# Patient Record
Sex: Male | Born: 1985 | Race: Black or African American | Hispanic: No | Smoking: Current every day smoker
Health system: Southern US, Community
[De-identification: ages and names within clinical notes are randomized; demographics above are authoritative.]

## PROBLEM LIST (undated history)

## (undated) DIAGNOSIS — N289 Disorder of kidney and ureter, unspecified: Secondary | ICD-10-CM

## (undated) DIAGNOSIS — N209 Urinary calculus, unspecified: Secondary | ICD-10-CM

## (undated) DIAGNOSIS — Z87442 Personal history of urinary calculi: Secondary | ICD-10-CM

## (undated) HISTORY — PX: KNEE SURGERY: SHX244

## (undated) HISTORY — PX: ORTHOPEDIC SURGERY: SHX850

---

## 2004-02-15 ENCOUNTER — Other Ambulatory Visit: Admission: RE | Admit: 2004-02-15 | Discharge: 2004-02-15 | Payer: Self-pay | Admitting: Dermatology

## 2004-08-24 ENCOUNTER — Emergency Department (HOSPITAL_COMMUNITY): Admission: EM | Admit: 2004-08-24 | Discharge: 2004-08-24 | Payer: Self-pay | Admitting: Emergency Medicine

## 2005-04-17 ENCOUNTER — Emergency Department (HOSPITAL_COMMUNITY): Admission: EM | Admit: 2005-04-17 | Discharge: 2005-04-17 | Payer: Self-pay | Admitting: Emergency Medicine

## 2005-06-12 ENCOUNTER — Emergency Department (HOSPITAL_COMMUNITY): Admission: EM | Admit: 2005-06-12 | Discharge: 2005-06-12 | Payer: Self-pay | Admitting: Emergency Medicine

## 2008-08-21 ENCOUNTER — Emergency Department (HOSPITAL_COMMUNITY): Admission: EM | Admit: 2008-08-21 | Discharge: 2008-08-21 | Payer: Self-pay | Admitting: Emergency Medicine

## 2008-10-10 ENCOUNTER — Emergency Department (HOSPITAL_COMMUNITY): Admission: EM | Admit: 2008-10-10 | Discharge: 2008-10-10 | Payer: Self-pay | Admitting: Emergency Medicine

## 2011-08-06 LAB — GC/CHLAMYDIA PROBE AMP, GENITAL
Chlamydia, DNA Probe: NEGATIVE
GC Probe Amp, Genital: POSITIVE — AB

## 2011-08-10 LAB — GC/CHLAMYDIA PROBE AMP, GENITAL
Chlamydia, DNA Probe: NEGATIVE
GC Probe Amp, Genital: POSITIVE — AB

## 2012-02-21 ENCOUNTER — Emergency Department (HOSPITAL_COMMUNITY)
Admission: EM | Admit: 2012-02-21 | Discharge: 2012-02-21 | Disposition: A | Discharge status: Home / Self Care | Payer: Self-pay | Attending: Emergency Medicine | Admitting: Emergency Medicine

## 2012-02-21 ENCOUNTER — Encounter (HOSPITAL_COMMUNITY): Payer: Self-pay | Admitting: *Deleted

## 2012-02-21 DIAGNOSIS — L0291 Cutaneous abscess, unspecified: Secondary | ICD-10-CM

## 2012-02-21 DIAGNOSIS — F17200 Nicotine dependence, unspecified, uncomplicated: Secondary | ICD-10-CM | POA: Insufficient documentation

## 2012-02-21 DIAGNOSIS — N4829 Other inflammatory disorders of penis: Secondary | ICD-10-CM | POA: Insufficient documentation

## 2012-02-21 LAB — RPR: RPR Ser Ql: NONREACTIVE

## 2012-02-21 MED ORDER — SULFAMETHOXAZOLE-TRIMETHOPRIM 800-160 MG PO TABS: 1.0000 | ORAL_TABLET | Freq: Two times a day (BID) | ORAL | Status: AC

## 2012-02-21 MED ORDER — SULFAMETHOXAZOLE-TMP DS 800-160 MG PO TABS
1.0000 | ORAL_TABLET | Freq: Once | ORAL | Status: AC
  Administered 2012-02-21: 1 via ORAL
  Filled 2012-02-21: qty 1

## 2012-02-21 NOTE — ED Notes (Signed)
Boil on penis, denies pain

## 2012-02-21 NOTE — ED Provider Notes (Signed)
History     CSN: [id number]  Arrival date & time 02/21/12  1050   First MD Initiated Contact with Patient 02/21/12 1118      Chief Complaint  Patient presents with  . Abscess    (Consider location/radiation/quality/duration/timing/severity/associated sxs/prior treatment) HPI Comments: Pt has a lesion on his penis.  "i thought it was a hair bump"  Onset ~ 1 week ago.  He has been squeezing it and it is getting larger.  No urethral d/c.  + sexually active.  Partner has no sxs.  Patient is a 26 y.o. male presenting with abscess. The history is provided by the patient. No language interpreter was used.  Abscess  This is a new problem. The problem has been gradually worsening. Affected Location: penile shaft. The problem is moderate. It is unknown what he was exposed to. The abscess first occurred at home. Pertinent negatives include no fever. There were no sick contacts. He has received no recent medical care.    History reviewed. No pertinent past medical history.  Past Surgical History  Procedure Date  . Orthopedic surgery     No family history on file.  History  Substance Use Topics  . Smoking status: Current Everyday Smoker -- 1.0 packs/day    Types: Cigarettes  . Smokeless tobacco: Not on file  . Alcohol Use: Yes     sometimes      Review of Systems  Constitutional: Negative for fever.  Genitourinary: Positive for genital sores and penile pain. Negative for dysuria, urgency, frequency, hematuria, discharge, penile swelling, scrotal swelling and testicular pain.  Musculoskeletal: Negative for arthralgias.  Skin: Negative for rash.  Neurological: Negative for light-headedness.  All other systems reviewed and are negative.    Allergies  Review of patient's allergies indicates no known allergies.  Home Medications   Current Outpatient Rx  Name Route Sig Dispense Refill  . SULFAMETHOXAZOLE-TRIMETHOPRIM 800-160 MG PO TABS Oral Take 1 tablet by mouth every 12  (twelve) hours. 20 tablet 0    BP 119/64  Pulse 62  Temp(Src) 97.9 F (36.6 C) (Oral)  Resp 18  Ht 5\' 8"  (1.727 m)  Wt 140 lb (63.504 kg)  BMI 21.29 kg/m2  SpO2 100%  Physical Exam  Nursing note and vitals reviewed. Constitutional: He is oriented to person, place, and time. He appears well-developed and well-nourished.  HENT:  Head: Normocephalic and atraumatic.  Eyes: EOM are normal.  Neck: Normal range of motion.  Cardiovascular: Normal rate, regular rhythm, normal heart sounds and intact distal pulses.   Pulmonary/Chest: Effort normal and breath sounds normal. No respiratory distress.  Abdominal: Soft. He exhibits no distension. There is no tenderness.  Genitourinary: Testes normal. Circumcised. Penile tenderness present. No phimosis or paraphimosis. No discharge found.       One lesion on R mid shaft of penis.  Area is fluctant and ~ 1.5 cm in diam.    No erosion of borders typical of chancre.     Musculoskeletal: Normal range of motion.  Neurological: He is alert and oriented to person, place, and time. No cranial nerve deficit. Coordination normal.  Skin: Skin is warm and dry.  Psychiatric: He has a normal mood and affect. Judgment normal.    ED Course  Procedures (including critical care time)   Labs Reviewed  RPR   No results found.   1. Abscess       MDM  Doubt syphilitic chancre.  Probably an infected hair follicle  Now inflamed and infected from  Squeezing. rx- bactrim DS, 20 Warm compresses.   RPR pending.   F/u at [facility].        Worthy Rancher, PA 02/21/12 1243  Worthy Rancher, PA 02/21/12 1247

## 2012-02-21 NOTE — Discharge Instructions (Signed)
Abscess An abscess (boil or furuncle) is an infected area that contains a collection of pus.  SYMPTOMS Signs and symptoms of an abscess include pain, tenderness, redness, or hardness. You may feel a moveable soft area under your skin. An abscess can occur anywhere in the body.  TREATMENT  A surgical cut (incision) may be made over your abscess to drain the pus. Gauze may be packed into the space or a drain may be looped through the abscess cavity (pocket). This provides a drain that will allow the cavity to heal from the inside outwards. The abscess may be painful for a few days, but should feel much better if it was drained.  Your abscess, if seen early, may not have localized and may not have been drained. If not, another appointment may be required if it does not get better on its own or with medications. HOME CARE INSTRUCTIONS   Only take over-the-counter or prescription medicines for pain, discomfort, or fever as directed by your caregiver.   Take your antibiotics as directed if they were prescribed. Finish them even if you start to feel better.   Keep the skin and clothes clean around your abscess.   If the abscess was drained, you will need to use gauze dressing to collect any draining pus. Dressings will typically need to be changed 3 or more times a day.   The infection may spread by skin contact with others. Avoid skin contact as much as possible.   Practice good hygiene. This includes regular hand washing, cover any draining skin lesions, and do not share personal care items.   If you participate in sports, do not share athletic equipment, towels, whirlpools, or personal care items. Shower after every practice or tournament.   If a draining area cannot be adequately covered:   Do not participate in sports.   Children should not participate in day care until the wound has healed or drainage stops.   If your caregiver has given you a follow-up appointment, it is very important  to keep that appointment. Not keeping the appointment could result in a much worse infection, chronic or permanent injury, pain, and disability. If there is any problem keeping the appointment, you must call back to this facility for assistance.  SEEK MEDICAL CARE IF:   You develop increased pain, swelling, redness, drainage, or bleeding in the wound site.   You develop signs of generalized infection including muscle aches, chills, fever, or a general ill feeling.   You have an oral temperature above 102 F (38.9 C).  MAKE SURE YOU:   Understand these instructions.   Will watch your condition.   Will get help right away if you are not doing well or get worse.  Document Released: 08/01/2005 Document Revised: 10/11/2011 Document Reviewed: 05/25/2008 [facility] Patient Information 2012 [place], [place].   i don't think this is a syphilis lesion but more of an abscess.  Take the antibiotic as directed and apply warm compresses several times daily.  Call the health dept and make an appt for further evaluation..  If the syphilis test is positive we will notify you.

## 2012-02-21 NOTE — ED Provider Notes (Signed)
Medical screening examination/treatment/procedure(s) were performed by non-physician practitioner and as supervising physician I was immediately available for consultation/collaboration.  Flint Melter, MD 02/21/12 [phone]

## 2012-03-15 ENCOUNTER — Emergency Department (HOSPITAL_COMMUNITY)
Admission: EM | Admit: 2012-03-15 | Discharge: 2012-03-15 | Disposition: A | Discharge status: Home / Self Care | Payer: Self-pay | Attending: Emergency Medicine | Admitting: Emergency Medicine

## 2012-03-15 ENCOUNTER — Encounter (HOSPITAL_COMMUNITY): Payer: Self-pay | Admitting: Emergency Medicine

## 2012-03-15 ENCOUNTER — Emergency Department (HOSPITAL_COMMUNITY)
Admission: EM | Admit: 2012-03-15 | Discharge: 2012-03-15 | Discharge status: Against Medical Advice | Payer: Self-pay | Attending: Emergency Medicine | Admitting: Emergency Medicine

## 2012-03-15 DIAGNOSIS — R197 Diarrhea, unspecified: Secondary | ICD-10-CM | POA: Insufficient documentation

## 2012-03-15 DIAGNOSIS — M549 Dorsalgia, unspecified: Secondary | ICD-10-CM | POA: Insufficient documentation

## 2012-03-15 DIAGNOSIS — F17200 Nicotine dependence, unspecified, uncomplicated: Secondary | ICD-10-CM | POA: Insufficient documentation

## 2012-03-15 DIAGNOSIS — M545 Low back pain, unspecified: Secondary | ICD-10-CM | POA: Insufficient documentation

## 2012-03-15 DIAGNOSIS — A09 Infectious gastroenteritis and colitis, unspecified: Secondary | ICD-10-CM | POA: Insufficient documentation

## 2012-03-15 DIAGNOSIS — R112 Nausea with vomiting, unspecified: Secondary | ICD-10-CM | POA: Insufficient documentation

## 2012-03-15 DIAGNOSIS — K529 Noninfective gastroenteritis and colitis, unspecified: Secondary | ICD-10-CM

## 2012-03-15 LAB — CBC
HCT: 44.3 % (ref 39.0–52.0)
Hemoglobin: 14.4 g/dL (ref 13.0–17.0)
MCH: 31.4 pg (ref 26.0–34.0)
MCHC: 32.5 g/dL (ref 30.0–36.0)
MCV: 96.5 fL (ref 78.0–100.0)
Platelets: 198 10*3/uL (ref 150–400)
RBC: 4.59 MIL/uL (ref 4.22–5.81)
RDW: 13.3 % (ref 11.5–15.5)
WBC: 6.7 10*3/uL (ref 4.0–10.5)

## 2012-03-15 LAB — DIFFERENTIAL
Basophils Absolute: 0 10*3/uL (ref 0.0–0.1)
Basophils Relative: 0 % (ref 0–1)
Eosinophils Absolute: 0.1 10*3/uL (ref 0.0–0.7)
Eosinophils Relative: 1 % (ref 0–5)
Lymphocytes Relative: 32 % (ref 12–46)
Lymphs Abs: 2.2 10*3/uL (ref 0.7–4.0)
Monocytes Absolute: 0.5 10*3/uL (ref 0.1–1.0)
Monocytes Relative: 8 % (ref 3–12)
Neutro Abs: 3.9 10*3/uL (ref 1.7–7.7)
Neutrophils Relative %: 59 % (ref 43–77)

## 2012-03-15 LAB — BASIC METABOLIC PANEL
BUN: 12 mg/dL (ref 6–23)
CO2: 27 mEq/L (ref 19–32)
Calcium: 9.9 mg/dL (ref 8.4–10.5)
Chloride: 102 mEq/L (ref 96–112)
Creatinine, Ser: 1.04 mg/dL (ref 0.50–1.35)
GFR calc Af Amer: >90 mL/min (ref >90)
GFR calc non Af Amer: >90 mL/min (ref >90)
Glucose, Bld: 100 mg/dL — ABNORMAL HIGH (ref 70–99)
Potassium: 3.9 mEq/L (ref 3.5–5.1)
Sodium: 136 mEq/L (ref 135–145)

## 2012-03-15 MED ORDER — SODIUM CHLORIDE 0.9 % IV BOLUS (SEPSIS): 1000.0000 mL | Freq: Once | INTRAVENOUS | Status: DC

## 2012-03-15 MED ORDER — KETOROLAC TROMETHAMINE 30 MG/ML IJ SOLN: 30.0000 mg | Freq: Once | INTRAMUSCULAR | Status: DC

## 2012-03-15 NOTE — ED Notes (Signed)
Called to tx x 2. Pt not in waiting room. Told by triage nurse pt stated he had a family emergency and had to leave

## 2012-03-15 NOTE — Discharge Instructions (Signed)
Start with a clear liquid diet today, then gradually advance to bland diet tomorrow. Take ibuprofen 3 times a day with meals for pain. See the Dr. of your choice for problems.   Back Exercises Back exercises help treat and prevent back injuries. The goal of back exercises is to increase the strength of your abdominal and back muscles and the flexibility of your back. These exercises should be started when you no longer have back pain. Back exercises include:  Pelvic Tilt. Lie on your back with your knees bent. Tilt your pelvis until the lower part of your back is against the floor. Hold this position 5 to 10 sec and repeat 5 to 10 times.   Knee to Chest. Pull first 1 knee up against your chest and hold for 20 to 30 seconds, repeat this with the other knee, and then both knees. This may be done with the other leg straight or bent, whichever feels better.   Sit-Ups or Curl-Ups. Bend your knees 90 degrees. Start with tilting your pelvis, and do a partial, slow sit-up, lifting your trunk only 30 to 45 degrees off the floor. Take at least 2 to 3 seconds for each sit-up. Do not do sit-ups with your knees out straight. If partial sit-ups are difficult, simply do the above but with only tightening your abdominal muscles and holding it as directed.   Hip-Lift. Lie on your back with your knees flexed 90 degrees. Push down with your feet and shoulders as you raise your hips a couple inches off the floor; hold for 10 seconds, repeat 5 to 10 times.   Back arches. Lie on your stomach, propping yourself up on bent elbows. Slowly press on your hands, causing an arch in your low back. Repeat 3 to 5 times. Any initial stiffness and discomfort should lessen with repetition over time.   Shoulder-Lifts. Lie face down with arms beside your body. Keep hips and torso pressed to floor as you slowly lift your head and shoulders off the floor.  Do not overdo your exercises, especially in the beginning. Exercises may cause  you some mild back discomfort which lasts for a few minutes; however, if the pain is more severe, or lasts for more than 15 minutes, do not continue exercises until you see your caregiver. Improvement with exercise therapy for back problems is slow.  See your caregivers for assistance with developing a proper back exercise program. Document Released: 11/29/2004 Document Revised: 10/11/2011 Document Reviewed: 10/22/2005 [facility] Patient Information 2012 [place], [place].Viral Gastroenteritis Viral gastroenteritis is also called stomach flu. This illness is caused by a certain type of germ (virus). It can cause sudden watery poop (diarrhea) and throwing up (vomiting). This can cause you to lose body fluids (dehydration). This illness usually lasts for 3 to 8 days. It usually goes away on its own. HOME CARE   Drink enough fluids to keep your pee (urine) clear or pale yellow. Drink small amounts of fluids often.   Ask your doctor how to replace body fluid losses (rehydration).   Avoid:   Foods high in sugar.   Alcohol.   Bubbly (carbonated) drinks.   Tobacco.   Juice.   Caffeine drinks.   Very hot or cold fluids.   Fatty, greasy foods.   Eating too much at one time.   Dairy products until 24 to 48 hours after your watery poop stops.   You may eat foods with active cultures (probiotics). They can be found in some yogurts and supplements.  Wash your hands well to avoid spreading the illness.   Only take medicines as told by your doctor. Do not give aspirin to children. Do not take medicines for watery poop (antidiarrheals).   Ask your doctor if you should keep taking your regular medicines.   Keep all doctor visits as told.  GET HELP RIGHT AWAY IF:   You cannot keep fluids down.   You do not pee at least once every 6 to 8 hours.   You are short of breath.   You see blood in your poop or throw up. This may look like coffee grounds.   You have belly (abdominal) pain that  gets worse or is just in one small spot (localized).   You keep throwing up or having watery poop.   You have a fever.   The patient is a child younger than 3 months, and he or she has a fever.   The patient is a child older than 3 months, and he or she has a fever and problems that do not go away.   The patient is a child older than 3 months, and he or she has a fever and problems that suddenly get worse.   The patient is a baby, and he or she has no tears when crying.  MAKE SURE YOU:   Understand these instructions.   Will watch your condition.   Will get help right away if you are not doing well or get worse.  Document Released: 04/09/2008 Document Revised: 10/11/2011 Document Reviewed: 08/08/2011 [facility] Patient Information 2012 [place], [place].  RESOURCE GUIDE  Dental Problems  Patients with Medicaid: [facility] [phone]                                           [phone] [address] [employer] [address] Phone:  [phone]                                                  Phone:  [phone]  If unable to pay or uninsured, contact:  [facility] or [facility]. to become qualified for the adult dental clinic.  Chronic Pain Problems Contact [facility]  [phone] Patients need to be referred by their primary care doctor.  Insufficient Money for Medicine Contact [employer]:  call "211" or [facility] 510-766-2349.  No Primary Care Doctor Call [facility]  [phone] Other agencies that provide inexpensive medical care    [facility]  [phone]    [facility] Internal Medicine  [phone]    [facility]  [phone]    [facility]  [phone]    [facility]  [phone]    [facility]  [phone]  Psychological Services [facility] Behavioral Health  [phone] [facility]  [phone] [facility] Mental Health   [phone] (emergency services  [phone])  Substance Abuse Resources [facility]  ([phone] [facility] [phone] [facility] [phone] Floydene Flock [phone] Residential & Outpatient Substance Abuse Program  [phone]  Abuse/Neglect [facility] Child Abuse Hotline (218) 202-4401 [place]  [address] Child Abuse Hotline 320-096-8710 (After Hours)  Emergency Shelter [facility] [phone]  M[facility] [phone] [facility] ([phone]  MRSA Hotline #:   409-034-7047    [facility] Resources  [facility]     [employer]                          [facility] Dept. [address] [place]                       [address]      [address]  Blondell Reveal Phone:  [phone]                                   Phone:  [phone]                 Phone:  [phone]  [facility] Mental Health Phone:  [phone]  [facility] Child Abuse Hotline (732)588-8678 [phone] (After Hours)

## 2012-03-15 NOTE — ED Provider Notes (Signed)
History     CSN: [id number]  Arrival date & time 03/15/12  1554   First MD Initiated Contact with Patient 03/15/12 1635      Chief Complaint  Patient presents with  . Back Pain  . Nausea    (Consider location/radiation/quality/duration/timing/severity/associated sxs/prior treatment) HPI Comments: Drayson Dorko is a 26 y.o. Male who complains of vomiting, diarrhea for 2 days. He denies fever, chills, abdominal pain, dysuria, blood in emesis or stool. His girlfriend was sick with a similar process 5 days ago. He has also had low back pain for 2 months worse when sitting; without known trauma as cause. He has not taken any medication for the back pain or the enteritis symptoms. He does not have chronic back pain or prior GI disturbances.  Patient is a 26 y.o. male presenting with back pain. The history is provided by the patient.  Back Pain     History reviewed. No pertinent past medical history.  Past Surgical History  Procedure Date  . Orthopedic surgery     History reviewed. No pertinent family history.  History  Substance Use Topics  . Smoking status: Current Everyday Smoker -- 1.0 packs/day    Types: Cigarettes  . Smokeless tobacco: Not on file  . Alcohol Use: Yes     sometimes      Review of Systems  Musculoskeletal: Positive for back pain.  All other systems reviewed and are negative.    Allergies  Review of patient's allergies indicates no known allergies.  Home Medications  No current outpatient prescriptions on file.  BP 126/67  Pulse 60  Temp(Src) 98.3 F (36.8 C) (Oral)  Resp 20  SpO2 95%  Physical Exam  Nursing note and vitals reviewed. Constitutional: He is oriented to person, place, and time. He appears well-developed and well-nourished.  HENT:  Head: Normocephalic and atraumatic.  Right Ear: External ear normal.  Left Ear: External ear normal.  Eyes: Conjunctivae and EOM are normal. Pupils are equal, round, and reactive to light.    Neck: Normal range of motion and phonation normal. Neck supple.  Cardiovascular: Normal rate, regular rhythm, normal heart sounds and intact distal pulses.   Pulmonary/Chest: Effort normal and breath sounds normal. He exhibits no bony tenderness.  Abdominal: Soft. Normal appearance. There is no tenderness.  Musculoskeletal: Normal range of motion.       No pain in low back with sitting. No palpable lumbar tenderness.  Neurological: He is alert and oriented to person, place, and time. He has normal strength. No cranial nerve deficit or sensory deficit. He exhibits normal muscle tone. Coordination normal.  Skin: Skin is warm, dry and intact.  Psychiatric: He has a normal mood and affect. His behavior is normal. Judgment and thought content normal.    ED Course  Procedures (including critical care time)  \ Emergency department treatment: IV fluids, and IV Toradol  Labs Reviewed - No data to display No results found.   1. Gastroenteritis, infectious, presumed   2. Low back pain       MDM  Signs, and symptoms of patient, is nontoxic and stable for discharge with outpatient management. His low back pain is incidental and likely represents nonspecific muscular pain. Doubt serous bacterial infection, metabolic instability, structural disorder of the lumbar spine or neuropathy.    Plan: Home Medications- Advil prn pain; Home Treatments- gradually advance diet; Recommended follow up- PCP of choice prn        Flint Melter, MD 03/17/12 1506

## 2012-03-15 NOTE — ED Notes (Signed)
Pt c/o Mid L side back pain intemrittant x 2 months. N/v/d x 2 days. Nad. Mm dry.

## 2012-03-15 NOTE — ED Notes (Signed)
Pt c/o L side mid back pain x 2 months-pain worse with movement.  with n/v/d x 2 days. Mm wet. nad

## 2012-04-21 ENCOUNTER — Encounter (HOSPITAL_COMMUNITY): Payer: Self-pay | Admitting: *Deleted

## 2012-04-21 ENCOUNTER — Emergency Department (HOSPITAL_COMMUNITY)
Admission: EM | Admit: 2012-04-21 | Discharge: 2012-04-21 | Disposition: A | Discharge status: Home / Self Care | Payer: Self-pay | Attending: Emergency Medicine | Admitting: Emergency Medicine

## 2012-04-21 DIAGNOSIS — L738 Other specified follicular disorders: Secondary | ICD-10-CM | POA: Insufficient documentation

## 2012-04-21 DIAGNOSIS — L678 Other hair color and hair shaft abnormalities: Secondary | ICD-10-CM | POA: Insufficient documentation

## 2012-04-21 DIAGNOSIS — L739 Follicular disorder, unspecified: Secondary | ICD-10-CM

## 2012-04-21 DIAGNOSIS — L0201 Cutaneous abscess of face: Secondary | ICD-10-CM

## 2012-04-21 MED ORDER — SULFAMETHOXAZOLE-TRIMETHOPRIM 800-160 MG PO TABS: 1.0000 | ORAL_TABLET | Freq: Two times a day (BID) | ORAL | Status: AC

## 2012-04-21 MED ORDER — LIDOCAINE-EPINEPHRINE (PF) 2 %-1:200000 IJ SOLN
INTRAMUSCULAR | Status: AC
  Administered 2012-04-21: 13:00:00
  Filled 2012-04-21: qty 20

## 2012-04-21 MED ORDER — HYDROCODONE-ACETAMINOPHEN 5-325 MG PO TABS: 1.0000 | ORAL_TABLET | ORAL | Status: AC | PRN

## 2012-04-21 NOTE — ED Notes (Signed)
PA in to I&D 

## 2012-04-21 NOTE — ED Notes (Signed)
Swollen area lt jaw x 1 month.

## 2012-04-21 NOTE — ED Notes (Signed)
Awaiting d/c papers. 

## 2012-04-21 NOTE — Discharge Instructions (Signed)
Abscess An abscess (boil or furuncle) is an infected area that contains a collection of pus.  SYMPTOMS Signs and symptoms of an abscess include pain, tenderness, redness, or hardness. You may feel a moveable soft area under your skin. An abscess can occur anywhere in the body.  TREATMENT  A surgical cut (incision) may be made over your abscess to drain the pus. Gauze may be packed into the space or a drain may be looped through the abscess cavity (pocket). This provides a drain that will allow the cavity to heal from the inside outwards. The abscess may be painful for a few days, but should feel much better if it was drained.  Your abscess, if seen early, may not have localized and may not have been drained. If not, another appointment may be required if it does not get better on its own or with medications. HOME CARE INSTRUCTIONS   Only take over-the-counter or prescription medicines for pain, discomfort, or fever as directed by your caregiver.   Take your antibiotics as directed if they were prescribed. Finish them even if you start to feel better.   Keep the skin and clothes clean around your abscess.   If the abscess was drained, you will need to use gauze dressing to collect any draining pus. Dressings will typically need to be changed 3 or more times a day.   The infection may spread by skin contact with others. Avoid skin contact as much as possible.   Practice good hygiene. This includes regular hand washing, cover any draining skin lesions, and do not share personal care items.   If you participate in sports, do not share athletic equipment, towels, whirlpools, or personal care items. Shower after every practice or tournament.   If a draining area cannot be adequately covered:   Do not participate in sports.   Children should not participate in day care until the wound has healed or drainage stops.   If your caregiver has given you a follow-up appointment, it is very important  to keep that appointment. Not keeping the appointment could result in a much worse infection, chronic or permanent injury, pain, and disability. If there is any problem keeping the appointment, you must call back to this facility for assistance.  SEEK MEDICAL CARE IF:   You develop increased pain, swelling, redness, drainage, or bleeding in the wound site.   You develop signs of generalized infection including muscle aches, chills, fever, or a general ill feeling.   You have an oral temperature above 102 F (38.9 C).  MAKE SURE YOU:   Understand these instructions.   Will watch your condition.   Will get help right away if you are not doing well or get worse.  Document Released: 08/01/2005 Document Revised: 10/11/2011 Document Reviewed: 05/25/2008 [facility] Patient Information 2012 [place], [place].   Start warm soaks as discussed with warm water for 10 minutes 4 times daily.  Start antibiotics intake until completed.  You can use the medicine prescribed if needed for extra pain relief if your ibuprofen is not helpful.  Do not drive within 4 hours of taking this medication as it will make you drowsy.  Return here for recheck if your symptoms worsen in any way.

## 2012-04-23 NOTE — ED Provider Notes (Signed)
Medical screening examination/treatment/procedure(s) were performed by non-physician practitioner and as supervising physician I was immediately available for consultation/collaboration.   Laray Anger, DO 04/23/12 1307

## 2012-04-23 NOTE — ED Provider Notes (Signed)
History     CSN: [id number]  Arrival date & time 04/21/12  1143   First MD Initiated Contact with Patient 04/21/12 1156      Chief Complaint  Patient presents with  . Abscess    (Consider location/radiation/quality/duration/timing/severity/associated sxs/prior treatment) HPI Comments: Alex Moore presents with a 1 month history of swelling along his left lower jaw line which has intermittently drained a small amount of white fluid.  It has become more tender the past several days.  He believes he has an ingrown hair causing the problem.  He denies fevers and chills and has no other complaints,  Including no dental pain.  Pain is constant but worse with palpation and pressure,  He has found no alleviators.    Patient is a 26 y.o. male presenting with abscess. The history is provided by the patient.  Abscess  Pertinent negatives include no fever.    History reviewed. No pertinent past medical history.  Past Surgical History  Procedure Date  . Orthopedic surgery     History reviewed. No pertinent family history.  History  Substance Use Topics  . Smoking status: Current Everyday Smoker -- 1.0 packs/day    Types: Cigarettes  . Smokeless tobacco: Not on file  . Alcohol Use: No     sometimes      Review of Systems  Constitutional: Negative for fever and chills.  HENT: Negative for facial swelling.   Respiratory: Negative for shortness of breath and wheezing.   Skin: Positive for wound. Negative for color change.  Neurological: Negative for numbness.    Allergies  Review of patient's allergies indicates no known allergies.  Home Medications   Current Outpatient Rx  Name Route Sig Dispense Refill  . HYDROCODONE-ACETAMINOPHEN 5-325 MG PO TABS Oral Take 1 tablet by mouth every 4 (four) hours as needed for pain. 15 tablet 0  . SULFAMETHOXAZOLE-TRIMETHOPRIM 800-160 MG PO TABS Oral Take 1 tablet by mouth 2 (two) times daily. 20 tablet 0    BP 115/65  Pulse 68   Temp 97.6 F (36.4 C) (Oral)  Resp 20  Ht 5\' 8"  (1.727 m)  Wt 140 lb (63.504 kg)  BMI 21.29 kg/m2  SpO2 100%  Physical Exam  Constitutional: He is oriented to person, place, and time. He appears well-developed and well-nourished.  HENT:  Head: Normocephalic.  Cardiovascular: Normal rate.   Pulmonary/Chest: Effort normal.  Musculoskeletal: He exhibits tenderness.  Neurological: He is alert and oriented to person, place, and time. No sensory deficit.  Skin:       Small,  Indurated lesion left anterior face along anterior edge of jawline,  No red streaking,  Fluctuance or surrounding erythema.      ED Course  Procedures (including critical care time)  Labs Reviewed - No data to display No results found.   1. Facial abscess   2. Folliculitis    Discussed warm soaks and antibiotics vs I & D.  Given this is prominent facial area,  Deferred on full I &D,  But willing needle aspiration.  INCISION AND DRAINAGE Performed by: Burgess Amor Consent: Verbal consent obtained. Risks and benefits: risks, benefits and alternatives were discussed Type: abscess  Body area: left jaw Anesthesia: local infiltration  Local anesthetic: lidocaine 2% with epinephrine  Anesthetic total: 1 ml  Complexity: simple needle aspiration.   Drainage: purulent  Drainage amount: trace  Packing material: none  Patient tolerance: Patient tolerated the procedure well with no immediate complications.  1 long facial hair was  removed from center of lesion.      MDM  Pt encouraged warm soaks,  Prescribed septra,  Hydrocodone.  Recheck if not improving over the next week.        Burgess Amor, [place] 04/23/12 [phone]

## 2012-07-19 ENCOUNTER — Emergency Department (HOSPITAL_COMMUNITY)
Admission: EM | Admit: 2012-07-19 | Discharge: 2012-07-19 | Disposition: A | Discharge status: Home / Self Care | Payer: Self-pay | Attending: Emergency Medicine | Admitting: Emergency Medicine

## 2012-07-19 ENCOUNTER — Encounter (HOSPITAL_COMMUNITY): Payer: Self-pay | Admitting: Emergency Medicine

## 2012-07-19 DIAGNOSIS — R109 Unspecified abdominal pain: Secondary | ICD-10-CM | POA: Insufficient documentation

## 2012-07-19 DIAGNOSIS — F17200 Nicotine dependence, unspecified, uncomplicated: Secondary | ICD-10-CM | POA: Insufficient documentation

## 2012-07-19 LAB — CBC
HCT: 44.7 % (ref 39.0–52.0)
Hemoglobin: 15.1 g/dL (ref 13.0–17.0)
MCH: 32.8 pg (ref 26.0–34.0)
MCHC: 33.8 g/dL (ref 30.0–36.0)
MCV: 97.2 fL (ref 78.0–100.0)
Platelets: 210 10*3/uL (ref 150–400)
RBC: 4.6 MIL/uL (ref 4.22–5.81)
RDW: 13.9 % (ref 11.5–15.5)
WBC: 8.3 10*3/uL (ref 4.0–10.5)

## 2012-07-19 LAB — LIPASE, BLOOD: Lipase: 26 U/L (ref 11–59)

## 2012-07-19 LAB — URINALYSIS, ROUTINE W REFLEX MICROSCOPIC
Bilirubin Urine: NEGATIVE
Glucose, UA: NEGATIVE mg/dL
Hgb urine dipstick: NEGATIVE
Ketones, ur: NEGATIVE mg/dL
Leukocytes, UA: NEGATIVE
Nitrite: NEGATIVE
Protein, ur: NEGATIVE mg/dL
Specific Gravity, Urine: 1.015 (ref 1.005–1.030)
Urobilinogen, UA: 0.2 mg/dL (ref 0.0–1.0)
pH: 7.5 (ref 5.0–8.0)

## 2012-07-19 LAB — COMPREHENSIVE METABOLIC PANEL
ALT: 15 U/L (ref 0–53)
AST: 29 U/L (ref 0–37)
Albumin: 4.2 g/dL (ref 3.5–5.2)
Alkaline Phosphatase: 52 U/L (ref 39–117)
BUN: 18 mg/dL (ref 6–23)
CO2: 30 mEq/L (ref 19–32)
Calcium: 9.7 mg/dL (ref 8.4–10.5)
Chloride: 101 mEq/L (ref 96–112)
Creatinine, Ser: 1.14 mg/dL (ref 0.50–1.35)
GFR calc Af Amer: >90 mL/min (ref >90)
GFR calc non Af Amer: 87 mL/min — ABNORMAL LOW (ref >90)
Glucose, Bld: 72 mg/dL (ref 70–99)
Potassium: 3.9 mEq/L (ref 3.5–5.1)
Sodium: 138 mEq/L (ref 135–145)
Total Bilirubin: 0.3 mg/dL (ref 0.3–1.2)
Total Protein: 7 g/dL (ref 6.0–8.3)

## 2012-07-19 MED ORDER — GI COCKTAIL ~~LOC~~
30.0000 mL | Freq: Once | ORAL | Status: AC
  Administered 2012-07-19: 30 mL via ORAL
  Filled 2012-07-19: qty 30

## 2012-07-19 MED ORDER — ONDANSETRON 8 MG PO TBDP
8.0000 mg | ORAL_TABLET | Freq: Once | ORAL | Status: AC
  Administered 2012-07-19: 8 mg via ORAL
  Filled 2012-07-19: qty 1

## 2012-07-19 MED ORDER — FAMOTIDINE 20 MG PO TABS
20.0000 mg | ORAL_TABLET | Freq: Once | ORAL | Status: AC
  Administered 2012-07-19: 20 mg via ORAL
  Filled 2012-07-19: qty 1

## 2012-07-19 MED ORDER — HYDROCODONE-ACETAMINOPHEN 5-325 MG PO TABS
1.0000 | ORAL_TABLET | Freq: Once | ORAL | Status: AC
  Administered 2012-07-19: 1 via ORAL
  Filled 2012-07-19: qty 1

## 2012-07-19 NOTE — ED Notes (Signed)
Pt discharged. Pt stable at time of discharge. pt has no questions regarding discharge at this time. Pt voiced understanding of discharge instructions.  

## 2012-07-19 NOTE — ED Notes (Signed)
Patient complaining of abdominal pain and nausea since yesterday. Denies vomiting / diarrhea.

## 2012-07-19 NOTE — ED Provider Notes (Signed)
History     CSN: [id number]  Arrival date & time 07/19/12  1900   First MD Initiated Contact with Patient 07/19/12 1924      Chief Complaint  Patient presents with  . Abdominal Pain  . Nausea    (Consider location/radiation/quality/duration/timing/severity/associated sxs/prior treatment) Patient is a 26 y.o. male presenting with abdominal pain. The history is provided by the patient.  Abdominal Pain The primary symptoms of the illness include abdominal pain. The primary symptoms of the illness do not include fever, shortness of breath, vomiting, diarrhea or dysuria.  Symptoms associated with the illness do not include constipation, hematuria or back pain.  pt c/o mid abdominal pain, nausea, 'upset stomach', for past day. No vomiting. Is eating/drinking. Had normal bm today. No fever or chills. No known bad food ingestion or ill contacts. Denies prior abdominal surgery. No hx pud, gallstones or pancreatitis. Pain mild-mod, dull, cramping, no radiation, without specific exacerbating or alleviating factors. No scrotal or testicular pain. No gu c/o. No back or flank pain. Denies cough or uri c/o. No cp or sob.       History reviewed. No pertinent past medical history.  Past Surgical History  Procedure Date  . Orthopedic surgery     History reviewed. No pertinent family history.  History  Substance Use Topics  . Smoking status: Current Every Day Smoker -- 1.0 packs/day    Types: Cigarettes  . Smokeless tobacco: Not on file  . Alcohol Use: Yes     occasionally      Review of Systems  Constitutional: Negative for fever.  HENT: Negative for sore throat and neck pain.   Eyes: Negative for redness.  Respiratory: Negative for cough and shortness of breath.   Cardiovascular: Negative for chest pain.  Gastrointestinal: Positive for abdominal pain. Negative for vomiting, diarrhea and constipation.  Genitourinary: Negative for dysuria, hematuria, flank pain, scrotal swelling and  testicular pain.  Musculoskeletal: Negative for back pain.  Skin: Negative for rash.  Neurological: Negative for headaches.  Hematological: Does not bruise/bleed easily.  Psychiatric/Behavioral: Negative for confusion.    Allergies  Review of patient's allergies indicates no known allergies.  Home Medications  No current outpatient prescriptions on file.  BP 131/79  Pulse 66  Temp 97.8 F (36.6 C) (Oral)  Resp 14  Ht 5\' 8"  (1.727 m)  Wt 140 lb (63.504 kg)  BMI 21.29 kg/m2  SpO2 100%  Physical Exam  Nursing note and vitals reviewed. Constitutional: He is oriented to person, place, and time. He appears well-developed and well-nourished. No distress.  HENT:  Head: Atraumatic.  Eyes: Conjunctivae normal are normal. No scleral icterus.  Neck: Neck supple. No tracheal deviation present.  Cardiovascular: Normal rate, regular rhythm, normal heart sounds and intact distal pulses.   Pulmonary/Chest: Effort normal and breath sounds normal. No accessory muscle usage. No respiratory distress.  Abdominal: Soft. Bowel sounds are normal. He exhibits no distension and no mass. There is no tenderness. There is no rebound and no guarding.  Genitourinary:       No cva tenderness. No hernias. No scrotal/testicular pain or tenderness.   Musculoskeletal: Normal range of motion.  Neurological: He is alert and oriented to person, place, and time.  Skin: Skin is warm and dry.  Psychiatric: He has a normal mood and affect.    ED Course  Procedures (including critical care time)   Labs Reviewed  CBC  COMPREHENSIVE METABOLIC PANEL  LIPASE, BLOOD  URINALYSIS, ROUTINE W REFLEX MICROSCOPIC  Results for orders placed during the hospital encounter of 07/19/12  CBC      Component Value Range   WBC 8.3  4.0 - 10.5 K/uL   RBC 4.60  4.22 - 5.81 MIL/uL   Hemoglobin 15.1  13.0 - 17.0 g/dL   HCT 04.5  40.9 - 81.1 %   MCV 97.2  78.0 - 100.0 fL   MCH 32.8  26.0 - 34.0 pg   MCHC 33.8  30.0 - 36.0  g/dL   RDW 91.4  78.2 - 95.6 %   Platelets 210  150 - 400 K/uL  COMPREHENSIVE METABOLIC PANEL      Component Value Range   Sodium 138  135 - 145 mEq/L   Potassium 3.9  3.5 - 5.1 mEq/L   Chloride 101  96 - 112 mEq/L   CO2 30  19 - 32 mEq/L   Glucose, Bld 72  70 - 99 mg/dL   BUN 18  6 - 23 mg/dL   Creatinine, Ser 2.13  0.50 - 1.35 mg/dL   Calcium 9.7  8.4 - 08.6 mg/dL   Total Protein 7.0  6.0 - 8.3 g/dL   Albumin 4.2  3.5 - 5.2 g/dL   AST 29  0 - 37 U/L   ALT 15  0 - 53 U/L   Alkaline Phosphatase 52  39 - 117 U/L   Total Bilirubin 0.3  0.3 - 1.2 mg/dL   GFR calc non Af Amer 87 (*) >90 mL/min   GFR calc Af Amer >90  >90 mL/min  LIPASE, BLOOD      Component Value Range   Lipase 26  11 - 59 U/L  URINALYSIS, ROUTINE W REFLEX MICROSCOPIC      Component Value Range   Color, Urine YELLOW  YELLOW   APPearance CLOUDY (*) CLEAR   Specific Gravity, Urine 1.015  1.005 - 1.030   pH 7.5  5.0 - 8.0   Glucose, UA NEGATIVE  NEGATIVE mg/dL   Hgb urine dipstick NEGATIVE  NEGATIVE   Bilirubin Urine NEGATIVE  NEGATIVE   Ketones, ur NEGATIVE  NEGATIVE mg/dL   Protein, ur NEGATIVE  NEGATIVE mg/dL   Urobilinogen, UA 0.2  0.0 - 1.0 mg/dL   Nitrite NEGATIVE  NEGATIVE   Leukocytes, UA NEGATIVE  NEGATIVE        MDM  Labs. pepcid po. Gi cocktail po. vicodin 1 po.  Recheck pt sleeping.  Recheck abd soft non tender.          Suzi Roots, MD 07/19/12 2051

## 2012-09-22 ENCOUNTER — Emergency Department (HOSPITAL_COMMUNITY)
Admission: EM | Admit: 2012-09-22 | Discharge: 2012-09-22 | Disposition: A | Discharge status: Home / Self Care | Payer: Self-pay | Attending: Emergency Medicine | Admitting: Emergency Medicine

## 2012-09-22 ENCOUNTER — Emergency Department (HOSPITAL_COMMUNITY): Payer: Self-pay

## 2012-09-22 ENCOUNTER — Encounter (HOSPITAL_COMMUNITY): Payer: Self-pay | Admitting: Emergency Medicine

## 2012-09-22 DIAGNOSIS — S62309A Unspecified fracture of unspecified metacarpal bone, initial encounter for closed fracture: Secondary | ICD-10-CM | POA: Insufficient documentation

## 2012-09-22 DIAGNOSIS — F17200 Nicotine dependence, unspecified, uncomplicated: Secondary | ICD-10-CM | POA: Insufficient documentation

## 2012-09-22 DIAGNOSIS — Y929 Unspecified place or not applicable: Secondary | ICD-10-CM | POA: Insufficient documentation

## 2012-09-22 DIAGNOSIS — Y9389 Activity, other specified: Secondary | ICD-10-CM | POA: Insufficient documentation

## 2012-09-22 DIAGNOSIS — X58XXXA Exposure to other specified factors, initial encounter: Secondary | ICD-10-CM | POA: Insufficient documentation

## 2012-09-22 DIAGNOSIS — S6291XA Unspecified fracture of right wrist and hand, initial encounter for closed fracture: Secondary | ICD-10-CM

## 2012-09-22 MED ORDER — HYDROCODONE-ACETAMINOPHEN 5-325 MG PO TABS
2.0000 | ORAL_TABLET | Freq: Once | ORAL | Status: AC
  Administered 2012-09-22: 2 via ORAL
  Filled 2012-09-22: qty 2

## 2012-09-22 MED ORDER — IBUPROFEN 800 MG PO TABS: 800.0000 mg | ORAL_TABLET | Freq: Three times a day (TID) | ORAL | Status: DC

## 2012-09-22 MED ORDER — HYDROCODONE-ACETAMINOPHEN 5-325 MG PO TABS: 1.0000 | ORAL_TABLET | ORAL | Status: DC | PRN
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
## 2012-09-25 ENCOUNTER — Ambulatory Visit (INDEPENDENT_AMBULATORY_CARE_PROVIDER_SITE_OTHER): Payer: Self-pay | Admitting: Orthopedic Surgery

## 2012-09-25 ENCOUNTER — Encounter: Payer: Self-pay | Admitting: Orthopedic Surgery

## 2012-09-25 DIAGNOSIS — S62329A Displaced fracture of shaft of unspecified metacarpal bone, initial encounter for closed fracture: Secondary | ICD-10-CM | POA: Insufficient documentation

## 2012-09-25 MED ORDER — IBUPROFEN 800 MG PO TABS: 800.0000 mg | ORAL_TABLET | Freq: Three times a day (TID) | ORAL | Status: DC

## 2012-09-25 MED ORDER — HYDROCODONE-ACETAMINOPHEN 5-325 MG PO TABS: 1.0000 | ORAL_TABLET | ORAL | Status: AC | PRN

## 2012-09-25 NOTE — Patient Instructions (Addendum)
Keep  Cast dry   Do not get wet   If it gets wet dry with a hair dryer on low setting and call the office   
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
## 2012-09-25 NOTE — Progress Notes (Signed)
Patient ID: Alex Moore, male   DOB: 1986/01/30, 26 y.o.   MRN: [id number] Splint tight   Redone

## 2012-10-23 ENCOUNTER — Ambulatory Visit (INDEPENDENT_AMBULATORY_CARE_PROVIDER_SITE_OTHER): Payer: Self-pay

## 2012-10-23 ENCOUNTER — Encounter: Payer: Self-pay | Admitting: Orthopedic Surgery

## 2012-10-23 ENCOUNTER — Ambulatory Visit (INDEPENDENT_AMBULATORY_CARE_PROVIDER_SITE_OTHER): Payer: Self-pay | Admitting: Orthopedic Surgery

## 2012-10-23 VITALS — BP 90/60 | Ht 68.0 in | Wt 144.8 lb

## 2012-10-23 DIAGNOSIS — S62329A Displaced fracture of shaft of unspecified metacarpal bone, initial encounter for closed fracture: Secondary | ICD-10-CM

## 2012-10-23 DIAGNOSIS — S6290XA Unspecified fracture of unspecified wrist and hand, initial encounter for closed fracture: Secondary | ICD-10-CM

## 2012-10-23 DIAGNOSIS — S62309A Unspecified fracture of unspecified metacarpal bone, initial encounter for closed fracture: Secondary | ICD-10-CM
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
## 2012-10-23 NOTE — Patient Instructions (Signed)
Apply tape to finger x 4 weeks

## 2012-12-10 ENCOUNTER — Ambulatory Visit: Payer: Self-pay | Admitting: Orthopedic Surgery

## 2012-12-11 ENCOUNTER — Ambulatory Visit: Payer: Self-pay | Admitting: Orthopedic Surgery

## 2013-05-03 ENCOUNTER — Emergency Department (HOSPITAL_COMMUNITY): Payer: Self-pay

## 2013-05-03 ENCOUNTER — Encounter (HOSPITAL_COMMUNITY): Payer: Self-pay | Admitting: *Deleted

## 2013-05-03 ENCOUNTER — Emergency Department (HOSPITAL_COMMUNITY)
Admission: EM | Admit: 2013-05-03 | Discharge: 2013-05-03 | Disposition: A | Discharge status: Home / Self Care | Payer: Self-pay | Attending: Emergency Medicine | Admitting: Emergency Medicine

## 2013-05-03 DIAGNOSIS — S62329A Displaced fracture of shaft of unspecified metacarpal bone, initial encounter for closed fracture: Secondary | ICD-10-CM | POA: Insufficient documentation

## 2013-05-03 DIAGNOSIS — F17200 Nicotine dependence, unspecified, uncomplicated: Secondary | ICD-10-CM | POA: Insufficient documentation

## 2013-05-03 DIAGNOSIS — Y9289 Other specified places as the place of occurrence of the external cause: Secondary | ICD-10-CM | POA: Insufficient documentation

## 2013-05-03 DIAGNOSIS — Y9389 Activity, other specified: Secondary | ICD-10-CM | POA: Insufficient documentation

## 2013-05-03 DIAGNOSIS — X58XXXA Exposure to other specified factors, initial encounter: Secondary | ICD-10-CM | POA: Insufficient documentation

## 2013-05-03 DIAGNOSIS — S62339A Displaced fracture of neck of unspecified metacarpal bone, initial encounter for closed fracture: Secondary | ICD-10-CM

## 2013-05-03 MED ORDER — HYDROCODONE-ACETAMINOPHEN 5-325 MG PO TABS: 1.0000 | ORAL_TABLET | ORAL | Status: DC | PRN
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
## 2013-05-06 NOTE — ED Provider Notes (Signed)
Medical screening examination/treatment/procedure(s) were performed by non-physician practitioner and as supervising physician I was immediately available for consultation/collaboration.   Joya Gaskins, MD 05/06/13 [phone]

## 2013-05-12 ENCOUNTER — Emergency Department (HOSPITAL_COMMUNITY)
Admission: EM | Admit: 2013-05-12 | Discharge: 2013-05-13 | Disposition: A | Discharge status: Home / Self Care | Payer: Self-pay | Attending: Emergency Medicine | Admitting: Emergency Medicine

## 2013-05-12 ENCOUNTER — Other Ambulatory Visit: Payer: Self-pay

## 2013-05-12 ENCOUNTER — Encounter (HOSPITAL_COMMUNITY): Payer: Self-pay | Admitting: Emergency Medicine

## 2013-05-12 DIAGNOSIS — Z8781 Personal history of (healed) traumatic fracture: Secondary | ICD-10-CM | POA: Insufficient documentation

## 2013-05-12 DIAGNOSIS — T400X1A Poisoning by opium, accidental (unintentional), initial encounter: Secondary | ICD-10-CM | POA: Insufficient documentation

## 2013-05-12 DIAGNOSIS — F3289 Other specified depressive episodes: Secondary | ICD-10-CM | POA: Insufficient documentation

## 2013-05-12 DIAGNOSIS — T394X2A Poisoning by antirheumatics, not elsewhere classified, intentional self-harm, initial encounter: Secondary | ICD-10-CM | POA: Insufficient documentation

## 2013-05-12 DIAGNOSIS — T391X1A Poisoning by 4-Aminophenol derivatives, accidental (unintentional), initial encounter: Secondary | ICD-10-CM | POA: Insufficient documentation

## 2013-05-12 DIAGNOSIS — F329 Major depressive disorder, single episode, unspecified: Secondary | ICD-10-CM

## 2013-05-12 DIAGNOSIS — F17200 Nicotine dependence, unspecified, uncomplicated: Secondary | ICD-10-CM | POA: Insufficient documentation

## 2013-05-12 DIAGNOSIS — Z9181 History of falling: Secondary | ICD-10-CM | POA: Insufficient documentation

## 2013-05-12 DIAGNOSIS — F32A Depression, unspecified: Secondary | ICD-10-CM

## 2013-05-12 LAB — SALICYLATE LEVEL: Salicylate Lvl: <2 mg/dL — ABNORMAL LOW (ref 2.8–20.0)

## 2013-05-12 LAB — CBC WITH DIFFERENTIAL/PLATELET
Basophils Absolute: 0 10*3/uL (ref 0.0–0.1)
Basophils Relative: 0 % (ref 0–1)
Eosinophils Relative: 0 % (ref 0–5)
HCT: 48.3 % (ref 39.0–52.0)
Hemoglobin: 16.8 g/dL (ref 13.0–17.0)
Lymphocytes Relative: 7 % — ABNORMAL LOW (ref 12–46)
Lymphs Abs: 1.9 10*3/uL (ref 0.7–4.0)
MCV: 95.8 fL (ref 78.0–100.0)
Monocytes Relative: 3 % (ref 3–12)
Neutro Abs: 24.1 10*3/uL — ABNORMAL HIGH (ref 1.7–7.7)
RBC: 5.04 MIL/uL (ref 4.22–5.81)
RDW: 13.8 % (ref 11.5–15.5)
WBC: 26.8 10*3/uL — ABNORMAL HIGH (ref 4.0–10.5)

## 2013-05-12 LAB — COMPREHENSIVE METABOLIC PANEL
AST: 26 U/L (ref 0–37)
Albumin: 5.6 g/dL — ABNORMAL HIGH (ref 3.5–5.2)
Alkaline Phosphatase: 66 U/L (ref 39–117)
BUN: 12 mg/dL (ref 6–23)
CO2: 26 mEq/L (ref 19–32)
Chloride: 100 mEq/L (ref 96–112)
GFR calc non Af Amer: 81 mL/min — ABNORMAL LOW (ref >90)
Potassium: 4.4 mEq/L (ref 3.5–5.1)
Total Bilirubin: 0.3 mg/dL (ref 0.3–1.2)

## 2013-05-12 LAB — URINALYSIS, ROUTINE W REFLEX MICROSCOPIC
Bilirubin Urine: NEGATIVE
Glucose, UA: NEGATIVE mg/dL
Nitrite: NEGATIVE
Specific Gravity, Urine: 1.025 (ref 1.005–1.030)
pH: 6 (ref 5.0–8.0)

## 2013-05-12 LAB — RAPID URINE DRUG SCREEN, HOSP PERFORMED
Barbiturates: NOT DETECTED
Opiates: POSITIVE — AB
Tetrahydrocannabinol: NOT DETECTED

## 2013-05-12 LAB — PROTIME-INR: Prothrombin Time: 13 seconds (ref 11.6–15.2)

## 2013-05-12 LAB — ACETAMINOPHEN LEVEL: Acetaminophen (Tylenol), Serum: 45.6 ug/mL — ABNORMAL HIGH (ref 10–30)

## 2013-05-12 MED ORDER — ONDANSETRON HCL 4 MG/2ML IJ SOLN
4.0000 mg | Freq: Once | INTRAMUSCULAR | Status: AC
  Administered 2013-05-12: 4 mg via INTRAVENOUS
  Filled 2013-05-12: qty 2

## 2013-05-12 MED ORDER — ALUM & MAG HYDROXIDE-SIMETH 200-200-20 MG/5ML PO SUSP: 30.0000 mL | ORAL | Status: DC | PRN

## 2013-05-12 MED ORDER — ZOLPIDEM TARTRATE 5 MG PO TABS: 5.0000 mg | ORAL_TABLET | Freq: Every evening | ORAL | Status: DC | PRN

## 2013-05-12 MED ORDER — IBUPROFEN 400 MG PO TABS: 600.0000 mg | ORAL_TABLET | Freq: Three times a day (TID) | ORAL | Status: DC | PRN

## 2013-05-12 MED ORDER — SODIUM CHLORIDE 0.9 % IV SOLN
INTRAVENOUS | Status: DC
  Administered 2013-05-12: 20:00:00 via INTRAVENOUS

## 2013-05-12 MED ORDER — NICOTINE 21 MG/24HR TD PT24: 21.0000 mg | MEDICATED_PATCH | Freq: Every day | TRANSDERMAL | Status: DC | PRN

## 2013-05-12 MED ORDER — ONDANSETRON HCL 4 MG PO TABS: 4.0000 mg | ORAL_TABLET | Freq: Three times a day (TID) | ORAL | Status: DC | PRN

## 2013-05-12 NOTE — ED Notes (Signed)
telepsych consult done. Pt cleared by psychiatrist
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
## 2013-05-12 NOTE — ED Provider Notes (Signed)
History    CSN: [id number] Arrival date & time 05/12/13  1847  First MD Initiated Contact with Patient 05/12/13 1941     Chief Complaint  Patient presents with  . Drug Overdose   (Consider location/radiation/quality/duration/timing/severity/associated sxs/prior Treatment) HPI Comments: Alex Moore is a 27 y.o. male who was distraught today and took an overdose of Tylenol. He, states that his girlfriend, with whom he lives, decided to leave his home, and breakup with him. He, states that at 5:15 PM, he took 20 Tylenol tablets, unknown strength, and 10 Hydrocodone tablets 325/5 mg. After that, he vomited twice. He saw white fluid, in the emesis. After that, he called in orders for assistance. He, states that at the time. He took the tablets he did not want to live. He, states that now, he is not suicidal. He denies recent illnesses. He was in ED 3 days ago with a right hand fracture after a fall, while drinking alcohol. There are no other known modifying factors.  Patient is a 27 y.o. male presenting with Overdose. The history is provided by the patient.  Drug Overdose   History reviewed. No pertinent past medical history. Past Surgical History  Procedure Laterality Date  . Orthopedic surgery    . Knee surgery      left   History reviewed. No pertinent family history. History  Substance Use Topics  . Smoking status: Current Every Day Smoker -- 1.00 packs/day    Types: Cigarettes  . Smokeless tobacco: Not on file  . Alcohol Use: No    Review of Systems  All other systems reviewed and are negative.    Allergies  Review of patient's allergies indicates no known allergies.  Home Medications   Current Outpatient Rx  Name  Route  Sig  Dispense  Refill  . acetaminophen (TYLENOL) 500 MG tablet   Oral   Take 500 mg by mouth every 6 (six) hours as needed for pain.         Marland Kitchen HYDROcodone-acetaminophen (NORCO/VICODIN) 5-325 MG per tablet   Oral   Take 1 tablet by mouth  every 4 (four) hours as needed.   20 tablet   0   . ibuprofen (ADVIL,MOTRIN) 800 MG tablet   Oral   Take 1 tablet (800 mg total) by mouth 3 (three) times daily.   90 tablet   1    BP 125/66  Pulse 71  Temp(Src) 98 F (36.7 C) (Oral)  Resp 17  SpO2 98% Physical Exam  Nursing note and vitals reviewed. Constitutional: He is oriented to person, place, and time. He appears well-developed and well-nourished.  HENT:  Head: Normocephalic and atraumatic.  Right Ear: External ear normal.  Left Ear: External ear normal.  Eyes: Conjunctivae and EOM are normal. Pupils are equal, round, and reactive to light.  Neck: Normal range of motion and phonation normal. Neck supple.  Cardiovascular: Normal rate, regular rhythm, normal heart sounds and intact distal pulses.   Pulmonary/Chest: Effort normal and breath sounds normal. He exhibits no bony tenderness.  Abdominal: Soft. Normal appearance. He exhibits no mass. There is no tenderness. There is no guarding.  Musculoskeletal: Normal range of motion.  Neurological: He is alert and oriented to person, place, and time. He has normal strength. No cranial nerve deficit or sensory deficit. He exhibits normal muscle tone. Coordination normal.  Skin: Skin is warm, dry and intact.  Psychiatric: His behavior is normal. Judgment and thought content normal.  He appears sad.  ED Course  Procedures (including critical care time) Medications  0.9 %  sodium chloride infusion ( Intravenous New Bag/Given 05/12/13 2025)   Patient Vitals for the past 24 hrs:  BP Temp Temp src Pulse Resp SpO2  05/12/13 2131 125/66 mmHg - - 71 17 98 %  05/12/13 1900 114/65 mmHg 98 F (36.7 C) Oral 67 18 99 %   Poison Control contacted by nursing.   2200- Telepsychiatric consultation, requested. Medically cleared.  Psychiatric Holding orders written.    Date: 05/12/13  Rate: 74  Rhythm: normal sinus rhythm and sinus arrhythmia  QRS Axis: normal  PR and QT Intervals:  normal  ST/T Wave abnormalities: nonspecific ST changes  PR and QRS Conduction Disutrbances:none  Narrative Interpretation:   Old EKG Reviewed: none available      Labs Reviewed  CBC WITH DIFFERENTIAL - Abnormal; Notable for the following:    WBC 26.8 (*)    Neutrophils Relative % 90 (*)    Lymphocytes Relative 7 (*)    Neutro Abs 24.1 (*)    All other components within normal limits  COMPREHENSIVE METABOLIC PANEL - Abnormal; Notable for the following:    Glucose, Bld 128 (*)    Calcium 11.0 (*)    Total Protein 8.9 (*)    Albumin 5.6 (*)    GFR calc non Af Amer 81 (*)    All other components within normal limits  SALICYLATE LEVEL - Abnormal; Notable for the following:    Salicylate Lvl <2.0 (*)    All other components within normal limits  ACETAMINOPHEN LEVEL - Abnormal; Notable for the following:    Acetaminophen (Tylenol), Serum 45.6 (*)    All other components within normal limits  ACETAMINOPHEN LEVEL - Abnormal; Notable for the following:    Acetaminophen (Tylenol), Serum 46.3 (*)    All other components within normal limits  ETHANOL  PROTIME-INR  URINALYSIS, ROUTINE W REFLEX MICROSCOPIC  URINE RAPID DRUG SCREEN (HOSP PERFORMED)    1. Overdose by acetaminophen, initial encounter   2. Overdose of opiate or related narcotic, initial encounter   3. Depression     MDM  Intentional overdose, with Tylenol, without toxic Tylenol level patient spontaneously vomited after taking the overdose, and likely reduce his GI burden of Tylenol. Associated narcotic overdose, without hypotension, respiratory depression, or mental status. Patient will need to be assessed by psychiatry.  Nursing Notes Reviewed/ Care Coordinated, and agree without changes. Applicable Imaging Reviewed.  Interpretation of Laboratory Data incorporated into ED treatment  Flint Melter, MD 05/12/13 2223

## 2013-05-12 NOTE — ED Notes (Signed)
Pt states he recently broke up with his girlfriend and has been feeling depressed since.

## 2013-05-12 NOTE — ED Provider Notes (Signed)
telepsyc md stated pt could be treated as an out patient.  Benny Lennert, MD 05/12/13 [phone]

## 2013-05-12 NOTE — ED Notes (Signed)
Poison control was called after patient's arrival and made aware of what the patient had ingested. Poison control called back once we received lab results. After reviewing results, Poison Control states they see no signs of toxicity.

## 2013-12-04 ENCOUNTER — Emergency Department (HOSPITAL_COMMUNITY): Payer: Self-pay

## 2013-12-04 ENCOUNTER — Encounter (HOSPITAL_COMMUNITY): Payer: Self-pay | Admitting: Emergency Medicine

## 2013-12-04 ENCOUNTER — Emergency Department (HOSPITAL_COMMUNITY)
Admission: EM | Admit: 2013-12-04 | Discharge: 2013-12-04 | Disposition: A | Discharge status: Home / Self Care | Payer: Self-pay | Attending: Emergency Medicine | Admitting: Emergency Medicine

## 2013-12-04 DIAGNOSIS — H109 Unspecified conjunctivitis: Secondary | ICD-10-CM | POA: Insufficient documentation

## 2013-12-04 DIAGNOSIS — Z791 Long term (current) use of non-steroidal anti-inflammatories (NSAID): Secondary | ICD-10-CM | POA: Insufficient documentation

## 2013-12-04 DIAGNOSIS — F17200 Nicotine dependence, unspecified, uncomplicated: Secondary | ICD-10-CM | POA: Insufficient documentation

## 2013-12-04 DIAGNOSIS — J069 Acute upper respiratory infection, unspecified: Secondary | ICD-10-CM | POA: Insufficient documentation

## 2013-12-04 DIAGNOSIS — Z79899 Other long term (current) drug therapy: Secondary | ICD-10-CM | POA: Insufficient documentation

## 2013-12-04 MED ORDER — BENZONATATE 100 MG PO CAPS: 200.0000 mg | ORAL_CAPSULE | Freq: Three times a day (TID) | ORAL | Status: DC | PRN

## 2013-12-04 NOTE — ED Provider Notes (Signed)
Medical screening examination/treatment/procedure(s) were performed by non-physician practitioner and as supervising physician I was immediately available for consultation/collaboration.      , MD 12/04/13 1446 

## 2013-12-04 NOTE — ED Notes (Signed)
Onset  2 days, cough, red left eye, with drainage, nasal congestion, pt states intermittent chest discomfort x 1 month.

## 2013-12-04 NOTE — Discharge Instructions (Signed)
Upper Respiratory Infection, Adult °An upper respiratory infection (URI) is also sometimes known as the common cold. The upper respiratory tract includes the nose, sinuses, throat, trachea, and bronchi. Bronchi are the airways leading to the lungs. Most people improve within 1 week, but symptoms can last up to 2 weeks. A residual cough may last even longer.  °CAUSES °Many different viruses can infect the tissues lining the upper respiratory tract. The tissues become irritated and inflamed and often become very moist. Mucus production is also common. A cold is contagious. You can easily spread the virus to others by oral contact. This includes kissing, sharing a glass, coughing, or sneezing. Touching your mouth or nose and then touching a surface, which is then touched by another person, can also spread the virus. °SYMPTOMS  °Symptoms typically develop 1 to 3 days after you come in contact with a cold virus. Symptoms vary from person to person. They may include: °· Runny nose. °· Sneezing. °· Nasal congestion. °· Sinus irritation. °· Sore throat. °· Loss of voice (laryngitis). °· Cough. °· Fatigue. °· Muscle aches. °· Loss of appetite. °· Headache. °· Low-grade fever. °DIAGNOSIS  °You might diagnose your own cold based on familiar symptoms, since most people get a cold 2 to 3 times a year. Your caregiver can confirm this based on your exam. Most importantly, your caregiver can check that your symptoms are not due to another disease such as strep throat, sinusitis, pneumonia, asthma, or epiglottitis. Blood tests, throat tests, and X-rays are not necessary to diagnose a common cold, but they may sometimes be helpful in excluding other more serious diseases. Your caregiver will decide if any further tests are required. °RISKS AND COMPLICATIONS  °You may be at risk for a more severe case of the common cold if you smoke cigarettes, have chronic heart disease (such as heart failure) or lung disease (such as asthma), or if  you have a weakened immune system. The very young and very old are also at risk for more serious infections. Bacterial sinusitis, middle ear infections, and bacterial pneumonia can complicate the common cold. The common cold can worsen asthma and chronic obstructive pulmonary disease (COPD). Sometimes, these complications can require emergency medical care and may be life-threatening. °PREVENTION  °The best way to protect against getting a cold is to practice good hygiene. Avoid oral or hand contact with people with cold symptoms. Wash your hands often if contact occurs. There is no clear evidence that vitamin C, vitamin E, echinacea, or exercise reduces the chance of developing a cold. However, it is always recommended to get plenty of rest and practice good nutrition. °TREATMENT  °Treatment is directed at relieving symptoms. There is no cure. Antibiotics are not effective, because the infection is caused by a virus, not by bacteria. Treatment may include: °· Increased fluid intake. Sports drinks offer valuable electrolytes, sugars, and fluids. °· Breathing heated mist or steam (vaporizer or shower). °· Eating chicken soup or other clear broths, and maintaining good nutrition. °· Getting plenty of rest. °· Using gargles or lozenges for comfort. °· Controlling fevers with ibuprofen or acetaminophen as directed by your caregiver. °· Increasing usage of your inhaler if you have asthma. °Zinc gel and zinc lozenges, taken in the first 24 hours of the common cold, can shorten the duration and lessen the severity of symptoms. Pain medicines may help with fever, muscle aches, and throat pain. A variety of non-prescription medicines are available to treat congestion and runny nose. Your caregiver   can make recommendations and may suggest nasal or lung inhalers for other symptoms.  °HOME CARE INSTRUCTIONS  °· Only take over-the-counter or prescription medicines for pain, discomfort, or fever as directed by your  caregiver. °· Use a warm mist humidifier or inhale steam from a shower to increase air moisture. This may keep secretions moist and make it easier to breathe. °· Drink enough water and fluids to keep your urine clear or pale yellow. °· Rest as needed. °· Return to work when your temperature has returned to normal or as your caregiver advises. You may need to stay home longer to avoid infecting others. You can also use a face mask and careful hand washing to prevent spread of the virus. °SEEK MEDICAL CARE IF:  °· After the first few days, you feel you are getting worse rather than better. °· You need your caregiver's advice about medicines to control symptoms. °· You develop chills, worsening shortness of breath, or brown or red sputum. These may be signs of pneumonia. °· You develop yellow or brown nasal discharge or pain in the face, especially when you bend forward. These may be signs of sinusitis. °· You develop a fever, swollen neck glands, pain with swallowing, or white areas in the back of your throat. These may be signs of strep throat. °SEEK IMMEDIATE MEDICAL CARE IF:  °· You have a fever. °· You develop severe or persistent headache, ear pain, sinus pain, or chest pain. °· You develop wheezing, a prolonged cough, cough up blood, or have a change in your usual mucus (if you have chronic lung disease). °· You develop sore muscles or a stiff neck. °Document Released: 04/17/2001 Document Revised: 01/14/2012 Document Reviewed: 02/23/2011 °ExitCare® Patient Information ©2014 [employer]. ° ° °Emergency Department Resource Guide °1) Find a Doctor and Pay Out of Pocket °Although you won't have to find out who is covered by your insurance plan, it is a good idea to ask around and get recommendations. You will then need to call the office and see if the doctor you have chosen will accept you as a new patient and what types of options they offer for patients who are self-pay. Some doctors offer discounts or will set  up payment plans for their patients who do not have insurance, but you will need to ask so you aren't surprised when you get to your appointment. ° °2) Contact Your Local Health Department °Not all health departments have doctors that can see patients for sick visits, but many do, so it is worth a call to see if yours does. If you don't know where your local health department is, you can check in your phone book. The CDC also has a tool to help you locate your state's health department, and many state websites also have listings of all of their local health departments. ° °3) Find a Walk-in Clinic °If your illness is not likely to be very severe or complicated, you may want to try a walk in clinic. These are popping up all over the country in pharmacies, drugstores, and shopping centers. They're usually staffed by nurse practitioners or physician assistants that have been trained to treat common illnesses and complaints. They're usually fairly quick and inexpensive. However, if you have serious medical issues or chronic medical problems, these are probably not your best option. ° °No Primary Care Doctor: °- Call [facility] at  832-8000 - they can help you locate a primary care doctor that  accepts your insurance, provides   certain services, etc. °- Physician Referral Service- 1-800-533-3463 ° °Chronic Pain Problems: °Organization         Address  Phone   Notes  °[facility]  (336) 297-2271 Patients need to be referred by their primary care doctor.  ° °Medication Assistance: °Organization         Address  Phone   Notes  °[place] [facility] [address] °[place], [place] [address] (336) 641-8030 --Must be a resident of [place] °-- Must have NO insurance coverage whatsoever (no Medicaid/ Medicare, etc.) °-- The pt. MUST have a primary care doctor that directs their care regularly and follows them in the community °  °[facility]  (866) 331-1348    °[employer]  (888) 892-1162   ° °Agencies that provide inexpensive medical care: °Organization         Address  Phone   Notes  °[facility]  (336) 832-8035   °[facility]    (336) 832-7272   °[facility] [address] °[place], [place] [address] (336) 832-4777   °[facility] [address], °[place] (336) 271-4999   °[facility]    (336) 373-0678   °[facility]    (336) 272-1050   °[facility] ° [address], [place] Phone:  (336) 832-4444, Fax:  (336) 832-4440 Hours of Operation:  9 am - 6 pm, M-F.  Also accepts Medicaid/Medicare and self-pay.  °[facility] ° [address], [place] Phone: (336) 832-3150, Fax: (336) 832-3151. Hours of Operation:  8:30 am - 5:30 pm, M-F.  Also accepts Medicaid and self-pay.  °[facility] [place] [address], [place] Phone: (336) 878-6027   °[facility] [address], [place], [place] (336)723-1848, Ext. 123 Mondays & Thursdays: 7-9 AM.  First 15 patients are seen on a first come, first serve basis. °  ° °Medicaid-accepting [place] Providers: ° °Organization         Address  Phone   Notes  °[facility] [address], [place] (336) 641-2100 Also accepts self-pay patients.  °[facility] [address], [place] ° (336) 856-9996   °[facility] [address], [place] (336) 288-8857   °[facility] [address] [place] [address], [place] (336) 299-7000   °Veita Bland [address], [place]  ° (336) 373-1557 Only accepts [place] Access Medicaid patients after they have their name applied to their card.  ° °Self-Pay (no insurance) in [place]: ° °Organization         Address  Phone   Notes  °Sickle Cell Patients, [facility] [address],  [place] (336) 832-1970   °[facility] [address], [place] (336) 832-4400   °[facility] ° [address], [place] (336) 992-4800   °[facility]/Dr. Osei-Bonsu ° [address] [place] [address], [place] or [address], [place] (336) [phone] Phone number for both [place] and [place] locations is the same.  °[facility] [address], [place] (336) 299-0000   °[facility] [address] [place] [address], [place] or [address] (336) 852-7530 °(336) 878-2260   °[facility] [address], [place] (336) 350-1642, phone; (336) 294-5005, fax Sees patients 1st and 3rd Saturday of every month.  Must not qualify for public or private insurance (i.e. Medicaid, Medicare, [place] Health Choice, Veterans' Benefits) • Household income should be no more than 200% of the poverty level •The clinic cannot treat you if you are pregnant or think you are pregnant • Sexually transmitted diseases are not treated at the clinic.  ° ° °Dental Care: °Organization         Address  Phone  Notes  °[facility] [address], [place] (336) 641-6152 Accepts children up to age 21 who are enrolled in Medicaid or [place] Health Choice; pregnant women with a Medicaid card; and children who have applied for Medicaid or [place] Health Choice, but were declined, whose parents can pay a reduced fee at time of service.  °[facility] [place]  [address], [place] (336) 641-7733 Accepts children up to age 21 who are enrolled in Medicaid or [place] Health Choice; pregnant women with a Medicaid card; and children who have applied for Medicaid or [place] Health Choice, but were declined, whose parents can pay a reduced fee at time of service.  °[facility] ° [address], [place] (336)  641-4533 Patients are seen by appointment only. Walk-ins are not accepted. [facility] will see patients 18 years of age and older. °Monday - Tuesday (8am-5pm) °Most Wednesdays (8:30-5pm) °$30 per visit, cash only  °[facility] ° [address], [place] (336) 641-4533 Patients are seen by appointment only. Walk-ins are not accepted. [facility] will see patients 18 years of age and older. °One Wednesday Evening (Monthly: Volunteer Based).  $30 per visit, cash only  °[facility]  (919) 537-3737 for adults; Children under age 4, call Graduate Pediatric Dentistry at ([phone]) [phone]. Children aged 4-14, please call (919) [phone] to request a pediatric application. ° Dental services are provided in all areas of dental care including fillings, crowns and bridges, complete and partial dentures, implants, gum treatment, root canals, and extractions. Preventive care is also provided. Treatment is provided to both adults and children. °Patients are selected via a lottery and there is often a waiting list. °  °[facility] [address], °[place] ° (336) 763-8833 [email or web address] °  °[facility] [address], [place], [place] (336)723-1848, Ext. 123 Second and Fourth Thursday of each month, opens at 6:30 AM; Clinic ends at 9 AM.  Patients are seen on a first-come first-served basis, and a limited number are seen during each clinic.  ° °[facility] ° [address], [place], [place] (336) 723-7904   Eligibility Requirements °You must have lived in [place], [place], or [place] counties for at least the last three months. °  You cannot be eligible for state or federal sponsored healthcare insurance, including Veterans Administration, Medicaid, or Medicare. °  You generally cannot be eligible for healthcare insurance through your employer.  °  How to apply: °Eligibility screenings are held every Tuesday and Wednesday afternoon  from 1:00 pm until 4:00 pm. You do not need an appointment for the interview!  °[facility] [address], [place], [place] 336-631-2330   °[facility]  336-342-8273   °[facility]  336-703-3100   °[facility]  336-570-6415   ° °  Behavioral Health Resources in the Community: °Intensive Outpatient Programs °Organization         Address  Phone  Notes  °[facility] [address], [place], [place] 336-878-6098   °[facility] [address], [place], [place] 336-832-9800   °[facility] [address], [place], [place] ° 336-882-2125   °[facility] [address],  °[place], [place] 1-800-853-5163 or 336-641-4981   °Substance Abuse Resources °Organization         Address  Phone  Notes  °[facility]  336-882-2125   °[facility]  336-784-9470   °[facility]  336-285-9073   °[facility]  336-845-3988   °Residential & Outpatient Substance Abuse Program  1-800-659-3381   °Psychological Services °Organization         Address  Phone  Notes  °[facility]  336- 832-9600   °[facility]  336- 378-7881   °[facility] [address], [place] 1-800-853-5163 or 336-641-4981   ° °Mobile Crisis Teams °Organization         Address  Phone  Notes  °[facility], Mobile Crisis Care Unit  1-877-626-1772   °Assertive °[facility] ° [address] [place], [place] 336-834-9664   °Sharon DeEsch [address] °[place] 336-554-5454   ° °Self-Help/Support Groups °Organization         Address  Phone             Notes  °[facility] - variety of support groups  336- 373-1402 Call for more information  °Narcotics Anonymous (NA), [facility] [address], °[place]  2 meetings at this location  ° °Residential Treatment  Programs °Organization         Address  Phone  Notes  °[facility] [address],    °[place]  1-866-801-8205   °[facility] ° [address], [place], [place] 704-293-8524   °[facility] [address], [place] 336-845-3988 Admissions: 8am-3pm M-F  °[facility] [address]    °[place], [place] 336-841-1104   °[facility] [address], [place], [place] 336-379-7146   °[facility] [address]  °[place], [place] 336-285-9073   °[facility] Intensive Outpatient [address], [place], [place] 336-852-3033   °[facility] [address]  °[place], [place] 1-877-615-2722 or 336-784-9470   °[facility] [address] [place], [place] 336-227-7417 Accepts Medicaid  °[facility] [address]  °[place] 1-800-659-3381 Substance Abuse/Addiction Treatment  ° °[place] Behavioral Health Resources °Organization         Address  Phone  Notes  °[facility]  (888) 581-9988   ° Brannon, PhD [address] [place], [place]   (336) 349-5553 or (336) 951-0000   °[facility]   [address] °[place], [place] (336) 349-4454   °[facility] [address], [place], [place] (336) 342-8316 Insurance/Medicaid/sponsorship through [facility]  °[facility] [address]                                    [place], [place] (336) 342-8316 Therapy/tele-psych/case  °[facility] [address]  ° [place],   [place] (336) 349-2233    °Dr. Arfeen  (336) 349-4544   °[facility] [place]  [employer] [place] [facility] 1) [address], [place] °2) [address], [place] °3)  [address], [place] (336) 349-3220 °(336) 342-7768 ° °(336) 342-8140   °[place] [facility] (336) 342-1394 or (336) 342-3537 (After Hours)    ° ° ° °

## 2013-12-04 NOTE — ED Provider Notes (Signed)
CSN: [id number]     Arrival date & time 12/04/13  1024 History   First MD Initiated Contact with Patient 12/04/13 1045     Chief Complaint  Patient presents with  . Conjunctivitis  . Nasal Congestion   (Consider location/radiation/quality/duration/timing/severity/associated sxs/prior Treatment) HPI Comments: Alex Moore is a 28 y.o. Male presenting with a 2 day history of uri symptoms including nasal congestion with clear rhinorrhea, sore throat, low grade subjective fever, left eye irritation and persistent drainage of clear tears and cough occasionally productive of green sputum.  Symptoms due to not include shortness of breath, nausea, vomiting or diarrhea.  The patient has taken dayquill prior to arrival with no significant improvement in symptoms.  He also describes a 1 month history of intermittent episodes of midsternal chest pain, aching in quality, occurs at rest and lasts 1-2 minutes, sometimes accompanied by sob.  The last episode of this symptom happened 2 days ago.  He denies diaphoresis, nausea, vomiting, dizziness, palpitations or other symptoms associated with these chest pain episodes.  He is a current everyday smoker.  He denies cocaine use.      The history is provided by the patient.    History reviewed. No pertinent past medical history. Past Surgical History  Procedure Laterality Date  . Orthopedic surgery    . Knee surgery      left   History reviewed. No pertinent family history. History  Substance Use Topics  . Smoking status: Current Every Day Smoker -- 0.50 packs/day    Types: Cigarettes  . Smokeless tobacco: Not on file  . Alcohol Use: 0.6 oz/week    1 Shots of liquor per week     Comment: occasional    Review of Systems  Constitutional: Positive for fever and fatigue.  HENT: Positive for congestion, rhinorrhea and sore throat. Negative for ear pain, sinus pressure, trouble swallowing and voice change.   Eyes: Positive for redness. Negative for  discharge.  Respiratory: Positive for cough. Negative for shortness of breath, wheezing and stridor.   Cardiovascular: Negative for chest pain.  Gastrointestinal: Negative for abdominal pain.  Genitourinary: Negative.     Allergies  Review of patient's allergies indicates no known allergies.  Home Medications   Current Outpatient Rx  Name  Route  Sig  Dispense  Refill  . Pseudoephedrine-APAP-DM (DAYQUIL MULTI-SYMPTOM COLD/FLU PO)   Oral   Take 2 capsules by mouth 2 (two) times daily.         Marland Kitchen acetaminophen (TYLENOL) 500 MG tablet   Oral   Take 500 mg by mouth every 6 (six) hours as needed for pain.         . benzonatate (TESSALON) 100 MG capsule   Oral   Take 2 capsules (200 mg total) by mouth 3 (three) times daily as needed for cough.   30 capsule   0   . HYDROcodone-acetaminophen (NORCO/VICODIN) 5-325 MG per tablet   Oral   Take 1 tablet by mouth every 4 (four) hours as needed.   20 tablet   0   . ibuprofen (ADVIL,MOTRIN) 800 MG tablet   Oral   Take 1 tablet (800 mg total) by mouth 3 (three) times daily.   90 tablet   1    BP 134/46  Pulse 78  Temp(Src) 98.2 F (36.8 C) (Oral)  Resp 18  Ht 5\' 8"  (1.727 m)  Wt 140 lb (63.504 kg)  BMI 21.29 kg/m2  SpO2 98% Physical Exam  Constitutional: He is oriented to  person, place, and time. He appears well-developed and well-nourished.  HENT:  Head: Normocephalic and atraumatic.  Right Ear: Tympanic membrane and ear canal normal.  Left Ear: Tympanic membrane and ear canal normal.  Nose: Mucosal edema and rhinorrhea present.  Mouth/Throat: Uvula is midline, oropharynx is clear and moist and mucous membranes are normal. No oropharyngeal exudate, posterior oropharyngeal edema, posterior oropharyngeal erythema or tonsillar abscesses.  Eyes: Conjunctivae are normal.  Cardiovascular: Normal rate, regular rhythm, normal heart sounds and normal pulses.   Pulmonary/Chest: Effort normal. No respiratory distress. He has no  wheezes. He has no rales.  Abdominal: Soft. There is no tenderness.  Musculoskeletal: Normal range of motion.  Neurological: He is alert and oriented to person, place, and time.  Skin: Skin is warm and dry. No rash noted.  Psychiatric: He has a normal mood and affect.    ED Course  Procedures (including critical care time) Labs Review Labs Reviewed - No data to display Imaging Review Dg Chest 2 View  12/04/2013   CLINICAL DATA:  Cough and chest congestion.  EXAM: CHEST  2 VIEW  COMPARISON:  None.  FINDINGS: The heart size and mediastinal contours are within normal limits. Both lungs are clear. The visualized skeletal structures are unremarkable.  IMPRESSION: Normal exam.   Electronically Signed   By: Geanie CooleyJim  Maxwell M.D.   On: 12/04/2013 12:04    EKG Interpretation   None       MDM   1. Acute URI    Pt prescribed tessalon perles.  Encouraged rest,  Increased fluid intake.  Recheck for worsened sx.  Patients labs and/or radiological studies were viewed and considered during the medical decision making and disposition process.  The patient appears reasonably screened and/or stabilized for discharge and I doubt any other medical condition or other [facility] requiring further screening, evaluation, or treatment in the ED at this time prior to discharge.     Burgess AmorJulie , PA-C 12/04/13 1431

## 2014-02-02 ENCOUNTER — Emergency Department (HOSPITAL_COMMUNITY)
Admission: EM | Admit: 2014-02-02 | Discharge: 2014-02-02 | Disposition: A | Discharge status: Home / Self Care | Payer: Self-pay | Attending: Emergency Medicine | Admitting: Emergency Medicine

## 2014-02-02 ENCOUNTER — Emergency Department (HOSPITAL_COMMUNITY): Payer: Self-pay

## 2014-02-02 ENCOUNTER — Encounter (HOSPITAL_COMMUNITY): Payer: Self-pay | Admitting: Emergency Medicine

## 2014-02-02 DIAGNOSIS — Z8781 Personal history of (healed) traumatic fracture: Secondary | ICD-10-CM | POA: Insufficient documentation

## 2014-02-02 DIAGNOSIS — R209 Unspecified disturbances of skin sensation: Secondary | ICD-10-CM | POA: Insufficient documentation

## 2014-02-02 DIAGNOSIS — M79641 Pain in right hand: Secondary | ICD-10-CM

## 2014-02-02 DIAGNOSIS — M25549 Pain in joints of unspecified hand: Secondary | ICD-10-CM | POA: Insufficient documentation

## 2014-02-02 DIAGNOSIS — F17200 Nicotine dependence, unspecified, uncomplicated: Secondary | ICD-10-CM | POA: Insufficient documentation

## 2014-02-02 NOTE — ED Notes (Signed)
Pain rt hand that extends into forearm for 2 weeks,   5 th finger "feels numb"  And discomfort to rt elbow.  Has had 2 fx to this hand , last one pt says 3 mos ago, No known recent injury.

## 2014-02-02 NOTE — Discharge Instructions (Signed)
The x-ray of your right hand reveals the fractures that you have sustained to be healing nicely. The pulses are good, and the color of your hand and temperature of your hand is good. Please see the orthopedic surgeon for additional evaluation and also for evaluation of the sensations that you are receiving in your hand.

## 2014-02-02 NOTE — ED Provider Notes (Signed)
CSN: [id number]     Arrival date & time 02/02/14  1043 History   First MD Initiated Contact with Patient 02/02/14 1118     Chief Complaint  Patient presents with  . Hand Pain     (Consider location/radiation/quality/duration/timing/severity/associated sxs/prior Treatment) HPI Comments: Patient is a 28 year old male who presents to the emergency department with hand pain. The patient has sustained 2 fractures involving the right hand in the last 6 months. He now complains of pain of the right hand that extends into the forearm. He also states that his fifth finger feels numb at times. He's not had any new injury or problem with the right hand. There's been no fever or chills reported. There's been no change in color or temperature of the hand.  Patient is a 28 y.o. male presenting with hand pain. The history is provided by the patient.  Hand Pain Associated symptoms include arthralgias. Pertinent negatives include no abdominal pain, chest pain, coughing or neck pain.    History reviewed. No pertinent past medical history. Past Surgical History  Procedure Laterality Date  . Orthopedic surgery    . Knee surgery      left   History reviewed. No pertinent family history. History  Substance Use Topics  . Smoking status: Current Every Day Smoker -- 0.50 packs/day    Types: Cigarettes  . Smokeless tobacco: Not on file  . Alcohol Use: 0.6 oz/week    1 Shots of liquor per week     Comment: occasional    Review of Systems  Constitutional: Negative for activity change.       All ROS Neg except as noted in HPI  HENT: Negative for nosebleeds.   Eyes: Negative for photophobia and discharge.  Respiratory: Negative for cough, shortness of breath and wheezing.   Cardiovascular: Negative for chest pain and palpitations.  Gastrointestinal: Negative for abdominal pain and blood in stool.  Genitourinary: Negative for dysuria, frequency and hematuria.  Musculoskeletal: Positive for arthralgias.  Negative for back pain and neck pain.  Skin: Negative.   Neurological: Negative for dizziness, seizures and speech difficulty.  Psychiatric/Behavioral: Negative for hallucinations and confusion.      Allergies  Review of patient's allergies indicates no known allergies.  Home Medications   Current Outpatient Rx  Name  Route  Sig  Dispense  Refill  . acetaminophen (TYLENOL) 500 MG tablet   Oral   Take 500 mg by mouth every 6 (six) hours as needed for pain.          BP 118/72  Pulse 64  Temp(Src) 98 F (36.7 C) (Oral)  Resp 12  Ht 5\' 8"  (1.727 m)  Wt 138 lb (62.596 kg)  BMI 20.99 kg/m2  SpO2 100% Physical Exam  Nursing note and vitals reviewed. Constitutional: He is oriented to person, place, and time. He appears well-developed and well-nourished.  Non-toxic appearance.  HENT:  Head: Normocephalic.  Right Ear: Tympanic membrane and external ear normal.  Left Ear: Tympanic membrane and external ear normal.  Eyes: EOM and lids are normal. Pupils are equal, round, and reactive to light.  Neck: Normal range of motion. Neck supple. Carotid bruit is not present.  Cardiovascular: Normal rate, regular rhythm, normal heart sounds, intact distal pulses and normal pulses.   Pulmonary/Chest: Breath sounds normal. No respiratory distress.  Abdominal: Soft. Bowel sounds are normal. There is no tenderness. There is no guarding.  Musculoskeletal: Normal range of motion.  There is good range of motion of the right  shoulder and elbow. There is no significant swelling of the dorsum of the right hand. There is decreased range of motion of the right fifth finger. There is good range of motion of the other fingers. The radial pulse is 2+. The capillary refill is less than 2 seconds.  Lymphadenopathy:       Head (right side): No submandibular adenopathy present.       Head (left side): No submandibular adenopathy present.    He has no cervical adenopathy.  Neurological: He is alert and  oriented to person, place, and time. He has normal strength. No cranial nerve deficit or sensory deficit.  No sensory changes involving the right or left upper extremity.  Skin: Skin is warm and dry.  Psychiatric: He has a normal mood and affect. His speech is normal.    ED Course  Procedures (including critical care time) Labs Review Labs Reviewed - No data to display Imaging Review Dg Hand Complete Right  02/02/2014   CLINICAL DATA:  Pain and swelling fifth metacarpal. History of fracture  EXAM: RIGHT HAND - COMPLETE 3+ VIEW  COMPARISON:  05/03/2013  FINDINGS: Chronic healed fracture of the fifth metacarpal with angular deformity. No acute fracture. No significant arthropathy.  IMPRESSION: Chronic healed fracture fifth metacarpal with angular deformity. No acute abnormality.   Electronically Signed   By: Marlan Palauharles  Clark M.D.   On: 02/02/2014 11:38     EKG Interpretation None      MDM Patient sustained 2 fractures to the right hand in about 6 months. He now presents to the emergency department complaining of pain that extends from the right hand into the forearm. He is right-hand dominant. His been no new injury reported.  X-ray of the right hand shows a chronic healed fracture of the fifth metacarpal with angulation deformity. No other abnormality appreciated.  The patient was given the results of the x-ray I reassured that no emergent situation going on today. Patient strongly encouraged to see his orthopedics specialist for additional evaluation of the pain is extending into his forearm and the occasional sensation of numbness.    Final diagnoses:  Hand pain, right    **I have reviewed nursing notes, vital signs, and all appropriate lab and imaging results for this patient.Kathie Dike*     M , PA-C 02/02/14 1155

## 2014-02-03 NOTE — ED Provider Notes (Signed)
Medical screening examination/treatment/procedure(s) were performed by non-physician practitioner and as supervising physician I was immediately available for consultation/collaboration.   Celene KrasJon R , MD 02/03/14 [phone]

## 2014-03-08 ENCOUNTER — Emergency Department (HOSPITAL_COMMUNITY)
Admission: EM | Admit: 2014-03-08 | Discharge: 2014-03-08 | Disposition: A | Discharge status: Home / Self Care | Payer: Self-pay | Attending: Emergency Medicine | Admitting: Emergency Medicine

## 2014-03-08 ENCOUNTER — Encounter (HOSPITAL_COMMUNITY): Payer: Self-pay | Admitting: Emergency Medicine

## 2014-03-08 DIAGNOSIS — L723 Sebaceous cyst: Secondary | ICD-10-CM | POA: Insufficient documentation

## 2014-03-08 DIAGNOSIS — L089 Local infection of the skin and subcutaneous tissue, unspecified: Secondary | ICD-10-CM

## 2014-03-08 DIAGNOSIS — F17200 Nicotine dependence, unspecified, uncomplicated: Secondary | ICD-10-CM | POA: Insufficient documentation

## 2014-03-08 MED ORDER — CEPHALEXIN 500 MG PO CAPS: 500.0000 mg | ORAL_CAPSULE | Freq: Four times a day (QID) | ORAL | Status: AC

## 2014-03-08 NOTE — ED Notes (Signed)
3 "knots" on rt post thigh, 1 has been there for app 1 year.

## 2014-03-08 NOTE — ED Provider Notes (Signed)
CSN: [id number]     Arrival date & time 03/08/14  1400 History   First MD Initiated Contact with Patient 03/08/14 1525     Chief Complaint  Patient presents with  . Abscess      HPI  She presents with swollen areas on her aspect of his right thigh. He states he's had 3 areas that are better but about the size of his thumb for the last year. One is become red and painful over the last several days.  It is not draining. His does not feel ill with fevers or systemic illness.  History reviewed. No pertinent past medical history. Past Surgical History  Procedure Laterality Date  . Orthopedic surgery    . Knee surgery      left   History reviewed. No pertinent family history. History  Substance Use Topics  . Smoking status: Current Every Day Smoker -- 0.50 packs/day    Types: Cigarettes  . Smokeless tobacco: Not on file  . Alcohol Use: 0.6 oz/week    1 Shots of liquor per week     Comment: occasional    Review of Systems  Constitutional: Negative for fever, chills, diaphoresis, appetite change and fatigue.  HENT: Negative for mouth sores, sore throat and trouble swallowing.   Eyes: Negative for visual disturbance.  Respiratory: Negative for cough, chest tightness, shortness of breath and wheezing.   Cardiovascular: Negative for chest pain.  Gastrointestinal: Negative for nausea, vomiting, abdominal pain, diarrhea and abdominal distention.  Endocrine: Negative for polydipsia, polyphagia and polyuria.  Genitourinary: Negative for dysuria, frequency and hematuria.  Musculoskeletal: Negative for gait problem.  Skin: Negative for color change, pallor and rash.       General areas in the right upper thigh. One of which has become red and painful  Neurological: Negative for dizziness, syncope, light-headedness and headaches.  Hematological: Does not bruise/bleed easily.  Psychiatric/Behavioral: Negative for behavioral problems and confusion.      Allergies  Review of patient's  allergies indicates no known allergies.  Home Medications   Prior to Admission medications   Medication Sig Start Date End Date Taking? Authorizing Provider  acetaminophen (TYLENOL) 500 MG tablet Take 1,000 mg by mouth every 6 (six) hours as needed for moderate pain.    Yes Historical Provider, MD  cephALEXin (KEFLEX) 500 MG capsule Take 1 capsule (500 mg total) by mouth 4 (four) times daily. 03/08/14 03/18/14  Rolland PorterMark , MD   BP 83/58  Pulse 65  Temp(Src) 97.9 F (36.6 C) (Oral)  Resp 18  Ht 5\' 8"  (1.727 m)  Wt 138 lb (62.596 kg)  BMI 20.99 kg/m2  SpO2 97% Physical Exam  Constitutional: He is oriented to person, place, and time. He appears well-developed and well-nourished. No distress.  HENT:  Head: Normocephalic.  Eyes: Conjunctivae are normal. Pupils are equal, round, and reactive to light. No scleral icterus.  Neck: Normal range of motion. Neck supple. No thyromegaly present.  Cardiovascular: Normal rate and regular rhythm.  Exam reveals no gallop and no friction rub.   No murmur heard. Pulmonary/Chest: Effort normal and breath sounds normal. No respiratory distress. He has no wheezes. He has no rales.  Abdominal: Soft. Bowel sounds are normal. He exhibits no distension. There is no tenderness. There is no rebound.  Musculoskeletal: Normal range of motion.  Neurological: He is alert and oriented to person, place, and time.  Skin: Skin is warm and dry. No rash noted.     Psychiatric: He has a normal  mood and affect. His behavior is normal.    ED Course  Procedures (including critical care time) Labs Review Labs Reviewed - No data to display  Imaging Review No results found.   EKG Interpretation None      MDM   Final diagnoses:  Infected sebaceous cyst    Likely no sign of frank abscess. Minimal warmth and tenderness. No fluctuance. Plan will be antibiotics and warm compresses. Recheck is more painful, bigger, or if any red streaking or fevers.    Rolland PorterMark  , MD 03/08/14 [phone]

## 2014-03-08 NOTE — Discharge Instructions (Signed)
The cysts on your leg have become infected. Use warm compresses to the cysts three times per day. Re check here if the infected area becomes bigger, more painful, or if you get fever, or red streaks from the area.  Epidermal Cyst An epidermal cyst is usually a small, painless lump under the skin. Cysts often occur on the face, neck, stomach, chest, or genitals. The cyst may be filled with a bad smelling paste. Do not pop your cyst. Popping the cyst can cause pain and puffiness (swelling). HOME CARE   Only take medicines as told by your doctor.  Take your medicine (antibiotics) as told. Finish it even if you start to feel better. GET HELP RIGHT AWAY IF:  Your cyst is tender, red, or puffy.  You are not getting better, or you are getting worse.  You have any questions or concerns. MAKE SURE YOU:  Understand these instructions.  Will watch your condition.  Will get help right away if you are not doing well or get worse. Document Released: 11/29/2004 Document Revised: 04/22/2012 Document Reviewed: 04/30/2011 [facility] Patient Information 2014 [employer] [place].

## 2014-09-15 ENCOUNTER — Emergency Department (HOSPITAL_COMMUNITY)
Admission: EM | Admit: 2014-09-15 | Discharge: 2014-09-15 | Disposition: A | Discharge status: Home / Self Care | Payer: Self-pay | Attending: Emergency Medicine | Admitting: Emergency Medicine

## 2014-09-15 ENCOUNTER — Encounter (HOSPITAL_COMMUNITY): Payer: Self-pay | Admitting: Cardiology

## 2014-09-15 DIAGNOSIS — Z720 Tobacco use: Secondary | ICD-10-CM | POA: Insufficient documentation

## 2014-09-15 DIAGNOSIS — R112 Nausea with vomiting, unspecified: Secondary | ICD-10-CM | POA: Insufficient documentation

## 2014-09-15 DIAGNOSIS — K219 Gastro-esophageal reflux disease without esophagitis: Secondary | ICD-10-CM | POA: Insufficient documentation

## 2014-09-15 LAB — I-STAT CHEM 8, ED
BUN: 8 mg/dL (ref 6–23)
CREATININE: 1 mg/dL (ref 0.50–1.35)
Calcium, Ion: 1.19 mmol/L (ref 1.12–1.23)
Chloride: 100 mEq/L (ref 96–112)
Glucose, Bld: 98 mg/dL (ref 70–99)
HCT: 51 % (ref 39.0–52.0)
HEMOGLOBIN: 17.3 g/dL — AB (ref 13.0–17.0)
Potassium: 4 mEq/L (ref 3.7–5.3)
SODIUM: 139 meq/L (ref 137–147)
TCO2: 28 mmol/L (ref 0–100)

## 2014-09-15 MED ORDER — OMEPRAZOLE 20 MG PO CPDR: DELAYED_RELEASE_CAPSULE | ORAL | Status: DC

## 2014-09-15 MED ORDER — ONDANSETRON HCL 4 MG PO TABS
4.0000 mg | ORAL_TABLET | Freq: Three times a day (TID) | ORAL | Status: DC | PRN
Start: 2014-09-15 — End: 2018-10-09

## 2014-09-15 NOTE — ED Notes (Signed)
Nausea and vomiting times 2 weeks.  Denies any pain.

## 2014-09-15 NOTE — Discharge Instructions (Signed)
Avoid fried, spicy or greasy foods, look at the diet sheet. Take the prilosec as prescribed. Use the zofran for nausea or vomiting. If you continue to have vomiting, call Dr Patty Sermonsehman's office to get an appointment to be evaluated.  Return to the ED if you have more than a 1/2 cup of blood in your vomitus, you get severe abdominal pain or you feel worse.

## 2014-09-15 NOTE — ED Provider Notes (Signed)
CSN: [id number]     Arrival date & time 09/15/14  1049 History  This chart was scribed for Ward GivensIva L , MD by Richarda Overlieichard Holland, ED Scribe. This patient was seen in room APA06/APA06 and the patient's care was started 11:45 AM.    Chief Complaint  Patient presents with  . Nausea   HPI HPI Comments: Alex Moore is a 28 y.o. male who presents to the Emergency Department complaining of intermittent nausea and vomiting for the past 2 - 3 weeks. Pt reports the most he vomits is 3 episodes in 1 day, but he can go a couple of days without vomiting. He says his gag reflex has been triggering, especially in the morning, and says he's not always nauseous when it happens and it is not triggered by food. He reports associated diarrhea for the last 3 to 4 days with about 2 episodes/day. He states he is not nauseated currently, and reports he vomited once this morning and it was a small amount of clear fluid. He reports he experiences some burning in his chest when he vomits and at other times he gets heartburn. He states he has not lost his appetite and has had no weight loss. He reports no change in his stress level that may be affecting his symptoms. Pt denies abdominal pain, fever, weakness. He states his mother had GERD  PCP none   History reviewed. No pertinent past medical history. Past Surgical History  Procedure Laterality Date  . Orthopedic surgery    . Knee surgery      left   History reviewed. No pertinent family history. History  Substance Use Topics  . Smoking status: Current Every Day Smoker -- 0.50 packs/day    Types: Cigarettes  . Smokeless tobacco: Not on file  . Alcohol Use: 0.6 oz/week    1 Shots of liquor per week     Comment: occasional   employed Drinks rarely  Review of Systems  Constitutional: Negative for fever and appetite change.  Gastrointestinal: Positive for nausea, vomiting and diarrhea. Negative for abdominal pain.  All other systems reviewed and are  negative.     Allergies  Review of patient's allergies indicates no known allergies.  Home Medications   Prior to Admission medications   Medication Sig Start Date End Date Taking? Authorizing Provider   BP 135/75 mmHg  Pulse 68  Temp(Src) 99.2 F (37.3 C) (Oral)  Resp 18  Ht 5\' 8"  (1.727 m)  Wt 140 lb (63.504 kg)  BMI 21.29 kg/m2  SpO2 99%  Vital signs normal   Physical Exam  Constitutional: He is oriented to person, place, and time. He appears well-developed and well-nourished.  Non-toxic appearance. He does not appear ill. No distress.  HENT:  Head: Normocephalic and atraumatic.  Right Ear: External ear normal.  Left Ear: External ear normal.  Nose: Nose normal. No mucosal edema or rhinorrhea.  Mouth/Throat: Oropharynx is clear and moist and mucous membranes are normal. No dental abscesses or uvula swelling.  Eyes: Conjunctivae and EOM are normal. Pupils are equal, round, and reactive to light.  Neck: Normal range of motion and full passive range of motion without pain. Neck supple.  Cardiovascular: Normal rate, regular rhythm and normal heart sounds.  Exam reveals no gallop and no friction rub.   No murmur heard. Pulmonary/Chest: Effort normal and breath sounds normal. No respiratory distress. He has no wheezes. He has no rhonchi. He has no rales. He exhibits no tenderness and no crepitus.  Abdominal:  Soft. Normal appearance and bowel sounds are normal. He exhibits no distension. There is no tenderness. There is no rebound and no guarding.  Musculoskeletal: Normal range of motion. He exhibits no edema or tenderness.  Moves all extremities well.   Neurological: He is alert and oriented to person, place, and time. He has normal strength. No cranial nerve deficit.  Skin: Skin is warm, dry and intact. No rash noted. No erythema. No pallor.  Psychiatric: He has a normal mood and affect. His speech is normal and behavior is normal. His mood appears not anxious.  Nursing note  and vitals reviewed.   ED Course  Procedures  DIAGNOSTIC STUDIES: Oxygen Saturation is 98% on RA, normal by my interpretation.    COORDINATION OF CARE: 11:55 AM Discussed treatment plan with pt at bedside and pt agreed to plan. Will check his electrolyes and if normal can be discharged. PT is having no symptoms at this time so no meds were given. He will be given a prescription for Prilosec and Zofran and if he continues to have symptoms he should be evaluated by gastroenterology.   Labs Review Results for orders placed or performed during the hospital encounter of 09/15/14  I-stat Chem 8, ED  Result Value Ref Range   Sodium 139 137 - 147 mEq/L   Potassium 4.0 3.7 - 5.3 mEq/L   Chloride 100 96 - 112 mEq/L   BUN 8 6 - 23 mg/dL   Creatinine, Ser 6.571.00 0.50 - 1.35 mg/dL   Glucose, Bld 98 70 - 99 mg/dL   Calcium, Ion 8.461.19 9.621.12 - 1.23 mmol/L   TCO2 28 0 - 100 mmol/L   Hemoglobin 17.3 (H) 13.0 - 17.0 g/dL   HCT 95.251.0 84.139.0 - 32.452.0 %   Laboratory interpretation all normal except concentrate Hb (istats are inaccurate for Hb)     Imaging Review No results found.   EKG Interpretation None      MDM     Final diagnoses:  Nausea and vomiting, vomiting of unspecified type  Gastroesophageal reflux disease without esophagitis    Discharge Medication List as of 09/15/2014 12:50 PM    START taking these medications   Details  omeprazole (PRILOSEC) 20 MG capsule Take 1 po BID x 2 weeks then once a day, Print    ondansetron (ZOFRAN) 4 MG tablet Take 1 tablet (4 mg total) by mouth every 8 (eight) hours as needed for nausea., Starting 09/15/2014, Until Discontinued, Print        Plan discharge   I personally performed the services described in this documentation, which was scribed in my presence. The recorded information has been reviewed and considered.  Devoria AlbeIva , MD, Armando GangFACEP      Ward GivensIva L , MD 09/15/14 [phone]

## 2018-09-23 ENCOUNTER — Encounter (HOSPITAL_COMMUNITY): Payer: Self-pay | Admitting: Emergency Medicine

## 2018-09-23 ENCOUNTER — Emergency Department (HOSPITAL_COMMUNITY): Payer: BLUE CROSS/BLUE SHIELD

## 2018-09-23 ENCOUNTER — Other Ambulatory Visit: Payer: Self-pay

## 2018-09-23 ENCOUNTER — Emergency Department (HOSPITAL_COMMUNITY)
Admission: EM | Admit: 2018-09-23 | Discharge: 2018-09-23 | Disposition: A | Discharge status: Home / Self Care | Payer: BLUE CROSS/BLUE SHIELD | Attending: Emergency Medicine | Admitting: Emergency Medicine

## 2018-09-23 DIAGNOSIS — Y9389 Activity, other specified: Secondary | ICD-10-CM | POA: Diagnosis not present

## 2018-09-23 DIAGNOSIS — Z79899 Other long term (current) drug therapy: Secondary | ICD-10-CM | POA: Diagnosis not present

## 2018-09-23 DIAGNOSIS — F17210 Nicotine dependence, cigarettes, uncomplicated: Secondary | ICD-10-CM | POA: Diagnosis not present

## 2018-09-23 DIAGNOSIS — Y999 Unspecified external cause status: Secondary | ICD-10-CM | POA: Insufficient documentation

## 2018-09-23 DIAGNOSIS — S3992XA Unspecified injury of lower back, initial encounter: Secondary | ICD-10-CM | POA: Diagnosis present

## 2018-09-23 DIAGNOSIS — S39012A Strain of muscle, fascia and tendon of lower back, initial encounter: Secondary | ICD-10-CM | POA: Diagnosis not present

## 2018-09-23 DIAGNOSIS — S46911A Strain of unspecified muscle, fascia and tendon at shoulder and upper arm level, right arm, initial encounter: Secondary | ICD-10-CM | POA: Diagnosis not present

## 2018-09-23 DIAGNOSIS — Y92410 Unspecified street and highway as the place of occurrence of the external cause: Secondary | ICD-10-CM | POA: Diagnosis not present

## 2018-09-23 MED ORDER — TRAMADOL HCL 50 MG PO TABS: 50.0000 mg | ORAL_TABLET | Freq: Four times a day (QID) | ORAL | 0 refills | Status: DC | PRN

## 2018-09-23 MED ORDER — IBUPROFEN 800 MG PO TABS: 800.0000 mg | ORAL_TABLET | Freq: Three times a day (TID) | ORAL | 0 refills | Status: DC

## 2018-09-23 NOTE — Discharge Instructions (Addendum)
Apply ice packs on/off to your shoulder.  Call Dr. Mort SawyersHarrison's office to arrange a follow-up appt in one week if needed
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
## 2018-09-29 ENCOUNTER — Telehealth: Payer: Self-pay | Admitting: Orthopedic Surgery
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
## 2018-09-29 NOTE — Telephone Encounter (Signed)
So if that's the protocol why the question ????  Follow the protocol

## 2018-09-30 NOTE — Telephone Encounter (Signed)
Called back to patient to notify. Left message to return call.

## 2018-09-30 NOTE — Telephone Encounter (Signed)
Reached patient, relayed. Voiced understanding.

## 2018-10-09 ENCOUNTER — Encounter: Payer: Self-pay | Admitting: Orthopaedic Surgery

## 2018-10-09 ENCOUNTER — Ambulatory Visit (INDEPENDENT_AMBULATORY_CARE_PROVIDER_SITE_OTHER): Payer: Self-pay | Admitting: Orthopaedic Surgery

## 2018-10-09 VITALS — BP 130/68 | HR 80 | Ht 68.0 in | Wt 154.0 lb

## 2018-10-09 DIAGNOSIS — M25511 Pain in right shoulder: Secondary | ICD-10-CM

## 2018-10-09 MED ORDER — NAPROXEN 500 MG PO TABS: 500.0000 mg | ORAL_TABLET | Freq: Two times a day (BID) | ORAL | 5 refills | Status: DC

## 2018-10-09 MED ORDER — HYDROCODONE-ACETAMINOPHEN 5-325 MG PO TABS: ORAL_TABLET | ORAL | 0 refills | Status: DC

## 2018-10-09 NOTE — Patient Instructions (Signed)
Steps to Quit Smoking Smoking tobacco can be bad for your health. It can also affect almost every organ in your body. Smoking puts you and people around you at risk for many serious long-lasting (chronic) diseases. Quitting smoking is hard, but it is one of the best things that you can do for your health. It is never too late to quit. What are the benefits of quitting smoking? When you quit smoking, you lower your risk for getting serious diseases and conditions. They can include:  Lung cancer or lung disease.  Heart disease.  Stroke.  Heart attack.  Not being able to have children (infertility).  Weak bones (osteoporosis) and broken bones (fractures).  If you have coughing, wheezing, and shortness of breath, those symptoms may get better when you quit. You may also get sick less often. If you are pregnant, quitting smoking can help to lower your chances of having a baby of low birth weight. What can I do to help me quit smoking? Talk with your doctor about what can help you quit smoking. Some things you can do (strategies) include:  Quitting smoking totally, instead of slowly cutting back how much you smoke over a period of time.  Going to in-person counseling. You are more likely to quit if you go to many counseling sessions.  Using resources and support systems, such as: ? Online chats with a counselor. ? Phone quitlines. ? Printed self-help materials. ? Support groups or group counseling. ? Text messaging programs. ? Mobile phone apps or applications.  Taking medicines. Some of these medicines may have nicotine in them. If you are pregnant or breastfeeding, do not take any medicines to quit smoking unless your doctor says it is okay. Talk with your doctor about counseling or other things that can help you.  Talk with your doctor about using more than one strategy at the same time, such as taking medicines while you are also going to in-person counseling. This can help make  quitting easier. What things can I do to make it easier to quit? Quitting smoking might feel very hard at first, but there is a lot that you can do to make it easier. Take these steps:  Talk to your family and friends. Ask them to support and encourage you.  Call phone quitlines, reach out to support groups, or work with a counselor.  Ask people who smoke to not smoke around you.  Avoid places that make you want (trigger) to smoke, such as: ? Bars. ? Parties. ? Smoke-break areas at work.  Spend time with people who do not smoke.  Lower the stress in your life. Stress can make you want to smoke. Try these things to help your stress: ? Getting regular exercise. ? Deep-breathing exercises. ? Yoga. ? Meditating. ? Doing a body scan. To do this, close your eyes, focus on one area of your body at a time from head to toe, and notice which parts of your body are tense. Try to relax the muscles in those areas.  Download or buy apps on your mobile phone or tablet that can help you stick to your quit plan. There are many free apps, such as QuitGuide from the CDC (Centers for Disease Control and Prevention). You can find more support from smokefree.gov and other websites.  This information is not intended to replace advice given to you by your health care provider. Make sure you discuss any questions you have with your health care provider. Document Released: 08/18/2009 Document   Revised: 06/19/2016 Document Reviewed: 03/08/2015 Elsevier Interactive Patient Education  2018 Elsevier Inc.  
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
## 2018-10-17 ENCOUNTER — Ambulatory Visit (HOSPITAL_COMMUNITY): Payer: BLUE CROSS/BLUE SHIELD | Attending: Specialist | Admitting: Specialist

## 2018-10-17 DIAGNOSIS — R29898 Other symptoms and signs involving the musculoskeletal system: Secondary | ICD-10-CM | POA: Insufficient documentation

## 2018-10-17 DIAGNOSIS — M25611 Stiffness of right shoulder, not elsewhere classified: Secondary | ICD-10-CM | POA: Insufficient documentation

## 2018-10-17 DIAGNOSIS — M25511 Pain in right shoulder: Secondary | ICD-10-CM | POA: Insufficient documentation

## 2018-10-20 ENCOUNTER — Encounter (HOSPITAL_COMMUNITY): Payer: Self-pay

## 2018-10-20 ENCOUNTER — Telehealth: Payer: Self-pay | Admitting: Orthopaedic Surgery

## 2018-10-20 ENCOUNTER — Ambulatory Visit (HOSPITAL_COMMUNITY): Payer: BLUE CROSS/BLUE SHIELD

## 2018-10-20 ENCOUNTER — Other Ambulatory Visit: Payer: Self-pay

## 2018-10-20 DIAGNOSIS — M25511 Pain in right shoulder: Secondary | ICD-10-CM

## 2018-10-20 DIAGNOSIS — R29898 Other symptoms and signs involving the musculoskeletal system: Secondary | ICD-10-CM | POA: Diagnosis not present

## 2018-10-20 DIAGNOSIS — M25611 Stiffness of right shoulder, not elsewhere classified: Secondary | ICD-10-CM
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
## 2018-10-20 NOTE — Patient Instructions (Signed)

## 2018-10-20 NOTE — Telephone Encounter (Signed)
Patient called asking about when is his MRI scheduled. I didn't see anything scheduled and told him that I would check with the clinical staff.  Please call and advise.

## 2018-10-22 ENCOUNTER — Ambulatory Visit (HOSPITAL_COMMUNITY): Payer: BLUE CROSS/BLUE SHIELD

## 2018-10-23 ENCOUNTER — Encounter: Payer: Self-pay | Admitting: Orthopaedic Surgery

## 2018-10-23 ENCOUNTER — Telehealth (HOSPITAL_COMMUNITY): Payer: Self-pay

## 2018-10-23 ENCOUNTER — Ambulatory Visit: Payer: BLUE CROSS/BLUE SHIELD | Admitting: Orthopaedic Surgery

## 2018-10-23 VITALS — BP 129/84 | HR 68 | Ht 68.0 in | Wt 150.0 lb

## 2018-10-23 DIAGNOSIS — M25511 Pain in right shoulder: Secondary | ICD-10-CM

## 2018-10-23 NOTE — Telephone Encounter (Signed)
Called patient regarding no show. Patient reports that he forgot about appointment. He saw Dr. Hilda LiasKeeling today and thought it was the only appointment he had scheduled and apologized. Patient was reminded of next appointment and to call if he was unable to make it.  Limmie PatriciaLaura , OTR/L,CBIS  [phone]
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
## 2018-10-28 ENCOUNTER — Encounter (HOSPITAL_COMMUNITY): Payer: Self-pay | Admitting: Occupational Therapy

## 2018-10-28 ENCOUNTER — Ambulatory Visit (HOSPITAL_COMMUNITY): Payer: BLUE CROSS/BLUE SHIELD | Admitting: Occupational Therapy

## 2018-10-28 DIAGNOSIS — M25511 Pain in right shoulder: Secondary | ICD-10-CM

## 2018-10-28 DIAGNOSIS — M25611 Stiffness of right shoulder, not elsewhere classified: Secondary | ICD-10-CM

## 2018-10-28 DIAGNOSIS — R29898 Other symptoms and signs involving the musculoskeletal system: Secondary | ICD-10-CM | POA: Diagnosis not present

## 2018-10-28 NOTE — Therapy (Signed)
[facility] [address] [place], [place], [address] Phone: [phone]   Fax:  [phone]  Occupational Therapy Treatment  Patient Details  Name: Alex Moore [place] MRN: [id number] Date of Birth: [birth year] Referring Provider (OT): Darreld McleanWayne Keeling, MD   Encounter Date: 10/28/2018  OT End of Session - 10/28/18 1117    Visit Number  2    Number of Visits  8    Date for OT Re-Evaluation  11/17/18    Authorization Type  [employer]BS - Insurance coverage unknown at time of evaluation.     OT Start Time  1037   pt arrived late   OT Stop Time  1115    OT Time Calculation (min)  38 min    Activity Tolerance  Patient tolerated treatment well    Behavior During Therapy  [facility] for tasks assessed/performed       History reviewed. No pertinent past medical history.  Past Surgical History:  Procedure Laterality Date  . KNEE SURGERY     left  . ORTHOPEDIC SURGERY      There were no vitals filed for this visit.  Subjective Assessment - 10/28/18 1036    Subjective   S: I've been doing those exercises but it's not any different really.     Currently in Pain?  No/denies         [facility] Assessment - 10/28/18 1036      Assessment   Medical Diagnosis  right shoulder pain      Precautions   Precautions  None               OT Treatments/Exercises (OP) - 10/28/18 1040      Exercises   Exercises  Shoulder      Shoulder Exercises: Supine   Protraction  PROM;5 reps;AROM;12 reps    Horizontal ABduction  PROM;5 reps;AROM;12 reps    External Rotation  PROM;5 reps;AROM;12 reps   abducted   Internal Rotation  PROM;5 reps;AROM;12 reps   abducted   Flexion  PROM;5 reps;AROM;12 reps    ABduction  PROM;5 reps;AROM;12 reps      Shoulder Exercises: Standing   Protraction  AROM;12 reps    Horizontal ABduction  AROM;12 reps    External Rotation  AROM;12 reps   abducted   Internal Rotation  AROM;12 reps   abducted   Flexion  AROM;12 reps     ABduction  AROM;12 reps      Shoulder Exercises: ROM/Strengthening   X to V Arms  10X    Proximal Shoulder Strengthening, Seated  10X each no rest breaks    Other ROM/Strengthening Exercises  red loop band: wall walks 5X, lateral wall slides, 10X      Manual Therapy   Manual Therapy  Soft tissue mobilization    Manual therapy comments  completed separately from therapeutic exercises    Soft tissue mobilization  myofascial release to right deltoid, trapezius, and scapularis regions to decrease pain and fascial restrictions and increase joint range of motion               OT Short Term Goals - 10/28/18 1053      OT SHORT TERM GOAL #1   Title  Patient will be educated and independent with his HEP to faciliate his progress in therapy and allow him to return to using his right arm during his daily and work related tasks at his highest level of independence.     Time  4  Period  Weeks    Status  On-going      OT SHORT TERM GOAL #2   Title  Patient will increase his RUE A/ROM to WNL to increase his ability to reach overhead with more comfort.     Time  4    Period  Weeks    Status  On-going      OT SHORT TERM GOAL #3   Title  Patient will increase his right UE strength to 5/5 in order to complete the needed lifting tasks at work as well as care of his 2 y/o son with less difficulty.     Time  4    Period  Weeks    Status  On-going      OT SHORT TERM GOAL #4   Title  Patient will report a pain level of 3/10 or less in his right shoulder while completing daily and work related tasks.     Time  4    Period  Weeks    Status  On-going      OT SHORT TERM GOAL #5   Title  Patient will decrease his right UE fascial restrictions to trace amount or less in order to increase his functional mobility and allow him to complete all necessary daily tasks using his right UE.    Time  4    Period  Weeks    Status  On-going               Plan - 10/28/18 1055    Clinical  Impression Statement  A: Pt reports he is completing exercises provided at evaluation. Initiated myofascial release, manual stretching, A/ROM, and theraband exercises. Pt initially with pain at 75% range, however improved with exercise completion and only experiencing pain at end range. Verbal cuing for form and technique during session. No increased pain at end of session, min/mod fatigue reported.     Plan  P: Update HEP for A/ROM, complete sidelying A/ROM vs supine, w arms       Patient will benefit from skilled therapeutic intervention in order to improve the following deficits and impairments:  Pain, Increased fascial restrictions, Decreased mobility, Decreased range of motion, Decreased strength, Impaired UE functional use  Visit Diagnosis: Other symptoms and signs involving the musculoskeletal system  Acute pain of right shoulder  Stiffness of right shoulder, not elsewhere classified    Problem List Patient Active Problem List   Diagnosis Date Noted  . Fracture, metacarpal shaft 09/25/2012   Ezra SitesLeslie Troxler, OTR/L  [phone] 10/28/2018, 11:17 AM   [facility] [address] [place], [place], [address] Phone: [phone]   Fax:  [phone]  Name: Alex Moore Shindler MRN: [id number] Date of Birth: 1986-10-25

## 2018-10-30 ENCOUNTER — Ambulatory Visit (HOSPITAL_COMMUNITY)
Admission: RE | Admit: 2018-10-30 | Discharge: 2018-10-30 | Disposition: A | Discharge status: Home / Self Care | Payer: BLUE CROSS/BLUE SHIELD | Source: Ambulatory Visit | Attending: Orthopaedic Surgery | Admitting: Orthopaedic Surgery

## 2018-10-30 DIAGNOSIS — M25511 Pain in right shoulder: Secondary | ICD-10-CM | POA: Insufficient documentation

## 2018-10-31 ENCOUNTER — Encounter (HOSPITAL_COMMUNITY): Payer: Self-pay

## 2018-10-31 ENCOUNTER — Ambulatory Visit (HOSPITAL_COMMUNITY): Payer: BLUE CROSS/BLUE SHIELD

## 2018-10-31 DIAGNOSIS — M25611 Stiffness of right shoulder, not elsewhere classified: Secondary | ICD-10-CM

## 2018-10-31 DIAGNOSIS — R29898 Other symptoms and signs involving the musculoskeletal system: Secondary | ICD-10-CM | POA: Diagnosis not present

## 2018-10-31 DIAGNOSIS — M25511 Pain in right shoulder: Secondary | ICD-10-CM

## 2018-10-31 NOTE — Therapy (Signed)
[facility] [address] [place], [place], [address] Phone: [phone]   Fax:  [phone]  Occupational Therapy Treatment  Patient Details  Name: Alex Moore MRN: [id number] Date of Birth: 09-15-1986 Referring Provider (OT): Darreld McleanWayne Keeling, MD   Encounter Date: 10/31/2018  OT End of Session - 10/31/18 1229    Visit Number  3    Number of Visits  8    Date for OT Re-Evaluation  11/17/18    Authorization Type  [employer] - $25 co pay no visit limit    OT Start Time  1203    OT Stop Time  1241    OT Time Calculation (min)  38 min    Activity Tolerance  Patient tolerated treatment well    Behavior During Therapy  [facility]WFL for tasks assessed/performed       History reviewed. No pertinent past medical history.  Past Surgical History:  Procedure Laterality Date  . KNEE SURGERY     left  . ORTHOPEDIC SURGERY      There were no vitals filed for this visit.  Subjective Assessment - 10/31/18 1219    Subjective   S: It's crazy how tight it gets when I'm doing the exercises.     Currently in Pain?  Yes    Pain Score  7     Pain Location  Shoulder    Pain Orientation  Right    Pain Descriptors / Indicators  Dull;Aching;Throbbing    Pain Type  Acute pain    Pain Radiating Towards  n/a    Pain Onset  More than a month ago    Pain Frequency  Constant    Aggravating Factors   llifting his son    Pain Relieving Factors  rest, pain medication    Effect of Pain on Daily Activities  max effect         [facility] OT Assessment - 10/31/18 1221      Assessment   Medical Diagnosis  right shoulder pain      Precautions   Precautions  None               OT Treatments/Exercises (OP) - 10/31/18 1221      Exercises   Exercises  Shoulder      Shoulder Exercises: Supine   Protraction  PROM;5 reps    Horizontal ABduction  PROM;5 reps    External Rotation  PROM;5 reps   abducted   Internal Rotation  PROM;5 reps   abducted   Flexion   PROM;5 reps    ABduction  PROM;5 reps      Shoulder Exercises: Prone   Other Prone Exercises  Middle trapezius strengthening; I, T, and Y arms using 3#, 10X      Shoulder Exercises: Sidelying   External Rotation  AROM;10 reps    Internal Rotation  AROM;10 reps    Flexion  AROM;10 reps    ABduction  AROM;10 reps    Other Sidelying Exercises  A/ROM, protraction; 10X    Other Sidelying Exercises  A/ROM, horizontal abduction, 10X      Shoulder Exercises: ROM/Strengthening   X to V Arms  12X    Ball on Wall  1' flexion 1' abduction green ball    Other ROM/Strengthening Exercises  red loop band: lateral wall slides, 10X      Manual Therapy   Manual Therapy  Soft tissue mobilization    Manual therapy comments  completed separately from therapeutic exercises  Soft tissue mobilization  myofascial release to right deltoid, trapezius, and scapularis regions to decrease pain and fascial restrictions and increase joint range of motion               OT Short Term Goals - 10/28/18 1053      OT SHORT TERM GOAL #1   Title  Patient will be educated and independent with his HEP to faciliate his progress in therapy and allow him to return to using his right arm during his daily and work related tasks at his highest level of independence.     Time  4    Period  Weeks    Status  On-going      OT SHORT TERM GOAL #2   Title  Patient will increase his RUE A/ROM to WNL to increase his ability to reach overhead with more comfort.     Time  4    Period  Weeks    Status  On-going      OT SHORT TERM GOAL #3   Title  Patient will increase his right UE strength to 5/5 in order to complete the needed lifting tasks at work as well as care of his 2 y/o son with less difficulty.     Time  4    Period  Weeks    Status  On-going      OT SHORT TERM GOAL #4   Title  Patient will report a pain level of 3/10 or less in his right shoulder while completing daily and work related tasks.     Time  4     Period  Weeks    Status  On-going      OT SHORT TERM GOAL #5   Title  Patient will decrease his right UE fascial restrictions to trace amount or less in order to increase his functional mobility and allow him to complete all necessary daily tasks using his right UE.    Time  4    Period  Weeks    Status  On-going               Plan - 10/31/18 1240    Clinical Impression Statement  A: Pt with pain and discomfort with all exercises. No specfic movements greater than others. Pt reports that the most discomfort and pain is during passive stretching. manual therapy completed to right anterior shoulder region near bicep tendon to decreased trigger point. Education completed throughout session regarding fascial restrictions and muscel knots. VC for form and technique as needed.     Plan  P: Apply kinsiotape to right shoulder if needed for joint stability, pain, and weakness. Add W arms and Y arms lift off.     Consulted and Agree with Plan of Care  Patient       Patient will benefit from skilled therapeutic intervention in order to improve the following deficits and impairments:  Pain, Increased fascial restrictions, Decreased mobility, Decreased range of motion, Decreased strength, Impaired UE functional use  Visit Diagnosis: Other symptoms and signs involving the musculoskeletal system  Acute pain of right shoulder  Stiffness of right shoulder, not elsewhere classified    Problem List Patient Active Problem List   Diagnosis Date Noted  . Fracture, metacarpal shaft 09/25/2012   Limmie PatriciaLaura , OTR/L,CBIS  [phone]  10/31/2018, 1:51 PM  [facility] [address] [place] [place], [place], [address] Phone: [phone]   Fax:  [phone]  Name: Alex Moore MRN: [id number]  Date of Birth: [birth year]

## 2018-11-04 ENCOUNTER — Encounter: Payer: Self-pay | Admitting: Orthopaedic Surgery

## 2018-11-04 ENCOUNTER — Ambulatory Visit: Payer: BLUE CROSS/BLUE SHIELD | Admitting: Orthopaedic Surgery

## 2018-11-04 VITALS — BP 140/87 | HR 78 | Ht 68.0 in | Wt 154.0 lb

## 2018-11-04 DIAGNOSIS — M25511 Pain in right shoulder: Secondary | ICD-10-CM

## 2018-11-04 NOTE — Progress Notes (Signed)
PROCEDURE NOTE:  The patient request injection, verbal consent was obtained.  The right shoulder was prepped appropriately after time out was performed.   Sterile technique was observed and injection of 1 cc of Depo-Medrol 40 mg with several cc's of plain xylocaine. Anesthesia was provided by ethyl chloride and a 20-gauge needle was used to inject the shoulder area. A posterior approach was used.  The injection was tolerated well.  A band aid dressing was applied.  The patient was advised to apply ice later today and tomorrow to the injection sight as needed.  He continues to have pain in the right shoulder. MRI was negative.  He is going to PT.  Return in three weeks.  Call if any problem.  Precautions discussed.   Electronically Signed Darreld McleanWayne , MD 12/31/201910:00 AM

## 2018-11-06 ENCOUNTER — Telehealth (HOSPITAL_COMMUNITY): Payer: Self-pay | Admitting: Internal Medicine

## 2018-11-06 ENCOUNTER — Ambulatory Visit (HOSPITAL_COMMUNITY): Payer: BLUE CROSS/BLUE SHIELD

## 2018-11-06 NOTE — Telephone Encounter (Signed)
11/06/18  caller said to cx but would be at the next appt

## 2018-11-07 ENCOUNTER — Encounter (HOSPITAL_COMMUNITY): Payer: Self-pay | Admitting: Occupational Therapy

## 2018-11-07 ENCOUNTER — Ambulatory Visit (HOSPITAL_COMMUNITY): Payer: BLUE CROSS/BLUE SHIELD | Attending: Orthopaedic Surgery | Admitting: Occupational Therapy

## 2018-11-07 DIAGNOSIS — M25611 Stiffness of right shoulder, not elsewhere classified: Secondary | ICD-10-CM | POA: Insufficient documentation

## 2018-11-07 DIAGNOSIS — M25511 Pain in right shoulder: Secondary | ICD-10-CM | POA: Diagnosis present

## 2018-11-07 DIAGNOSIS — R29898 Other symptoms and signs involving the musculoskeletal system: Secondary | ICD-10-CM

## 2018-11-07 NOTE — Patient Instructions (Signed)
Side Lying Exercises: Complete 10-15X each, 1-2x/day   1) Sidelying Flexion:   Lie on your side with your affected arm up.  Lift the arm forward and pull your shoulder blade down.  NO WEIGHT     2) Sidelying abduction:   Lie on your side with your arm down straight at your side.  Raise the arm up and overhead, keeping the elbow straight.  You may turn the palm forward and inward if you can.     3) Sidelying horizontal abduction:   Lie on your side with arm straight up towards ceiling. Lower the arm straight out to face the wall and bring back up towards ceiling.     4) Sidelying internal/external rotation:   Lie on side with elbow bent. Lower and raise forearm in direction of the ceiling, keeping elbow bent and by the side.        Repeat all exercises 10-15 times, 1-2 times per day.  1) Shoulder Protraction    Begin with elbows by your side, slowly "punch" straight out in front of you.      2) Shoulder Flexion  Standing:         Begin with arms at your side with thumbs pointed up, slowly raise both arms up and forward towards overhead.               3) Horizontal abduction/adduction  Standing:           Begin with arms straight out in front of you, bring out to the side in at "T" shape. Keep arms straight entire time.                 4) Internal & External Rotation    *No band* -Stand with elbows at the side and elbows bent 90 degrees. Move your forearms away from your body, then bring back inward toward the body.     5) Shoulder Abduction  Standing:        Move your arms out to the side so that they go overhead, in a jumping jack or snow angel movement.    6) X to V arms (cheerleader move):  Begin with arms straight down, crossed in front of body in an "X". Keeping arms crossed, lift arms straight up overhead. Then spread arms apart into a "V" shape.  Bring back together into x and lower down to starting position.     7) W arms:  Begin with elbows bent and even with shoulders, hands pointing to ceiling. Keeping elbows at shoulder level, 1-shrug shoulders up, 2-squeeze shoulder blades together, and 3-relax.

## 2018-11-07 NOTE — Therapy (Signed)
[facility] [address] [place] [place], [place], [address] Phone: [phone]   Fax:  [phone]  Occupational Therapy Treatment  Patient Details  Name: Alex Moore MRN: [id number] Date of Birth: [birth year] Referring Provider (OT): Darreld McleanWayne Keeling, MD   Encounter Date: 11/07/2018  OT End of Session - 11/07/18 0946    Visit Number  4    Number of Visits  8    Date for OT Re-Evaluation  11/17/18    Authorization Type  [employer] - $25 co pay no visit limit    OT Start Time  0904    OT Stop Time  0944    OT Time Calculation (min)  40 min    Activity Tolerance  Patient tolerated treatment well    Behavior During Therapy  [facility] tasks assessed/performed       History reviewed. No pertinent past medical history.  Past Surgical History:  Procedure Laterality Date  . KNEE SURGERY     left  . ORTHOPEDIC SURGERY      There were no vitals filed for this visit.  Subjective Assessment - 11/07/18 0903    Subjective   S: The MRI was negative for a tear.     Currently in Pain?  Yes    Pain Score  5     Pain Location  Shoulder    Pain Orientation  Right    Pain Descriptors / Indicators  Aching;Dull;Sore    Pain Type  Acute pain    Pain Radiating Towards  N/A    Pain Onset  More than a month ago    Pain Frequency  Constant    Aggravating Factors   lifting items    Pain Relieving Factors  rest, pain medication    Effect of Pain on Daily Activities  mod/max effect     Multiple Pain Sites  No         [facility] OT Assessment - 11/07/18 0903      Assessment   Medical Diagnosis  right shoulder pain      Precautions   Precautions  None               OT Treatments/Exercises (OP) - 11/07/18 0907      Exercises   Exercises  Shoulder      Shoulder Exercises: Supine   Protraction  PROM;5 reps    Horizontal ABduction  PROM;5 reps    External Rotation  PROM;5 reps   abducted   Internal Rotation  PROM;5 reps   abducted   Flexion   PROM;5 reps    ABduction  PROM;5 reps      Shoulder Exercises: Sidelying   External Rotation  AROM;10 reps    Internal Rotation  AROM;10 reps    Flexion  AROM;10 reps    ABduction  AROM;10 reps    Other Sidelying Exercises  A/ROM, protraction; 10X    Other Sidelying Exercises  A/ROM, horizontal abduction, 10X      Shoulder Exercises: Standing   Protraction  Strengthening;10 reps    Protraction Weight (lbs)  1    Horizontal ABduction  Strengthening;10 reps    Horizontal ABduction Weight (lbs)  1    External Rotation  Strengthening;10 reps   abducted   External Rotation Weight (lbs)  1    Internal Rotation  Strengthening;10 reps   abducted   Internal Rotation Weight (lbs)  1    Flexion  Strengthening;10 reps    Shoulder Flexion Weight (lbs)  1    ABduction  Strengthening;10 reps    Shoulder ABduction Weight (lbs)  1      Shoulder Exercises: ROM/Strengthening   "W" Arms  10X    X to V Arms  12X, 1#    Ball on Wall  1' flexion 1' abduction green ball    Other ROM/Strengthening Exercises  Y lift off, 10X      Manual Therapy   Manual Therapy  Soft tissue mobilization    Manual therapy comments  completed separately from therapeutic exercises    Soft tissue mobilization  myofascial release to right deltoid, trapezius, and scapularis regions to decrease pain and fascial restrictions and increase joint range of motion             OT Education - 11/07/18 0924    Education Details  A/ROM sidelying and standing    Person(s) Educated  Patient    Methods  Explanation;Demonstration;Handout    Comprehension  Verbalized understanding;Returned demonstration       OT Short Term Goals - 10/28/18 1053      OT SHORT TERM GOAL #1   Title  Patient will be educated and independent with his HEP to faciliate his progress in therapy and allow him to return to using his right arm during his daily and work related tasks at his highest level of independence.     Time  4    Period  Weeks     Status  On-going      OT SHORT TERM GOAL #2   Title  Patient will increase his RUE A/ROM to WNL to increase his ability to reach overhead with more comfort.     Time  4    Period  Weeks    Status  On-going      OT SHORT TERM GOAL #3   Title  Patient will increase his right UE strength to 5/5 in order to complete the needed lifting tasks at work as well as care of his 2 y/o son with less difficulty.     Time  4    Period  Weeks    Status  On-going      OT SHORT TERM GOAL #4   Title  Patient will report a pain level of 3/10 or less in his right shoulder while completing daily and work related tasks.     Time  4    Period  Weeks    Status  On-going      OT SHORT TERM GOAL #5   Title  Patient will decrease his right UE fascial restrictions to trace amount or less in order to increase his functional mobility and allow him to complete all necessary daily tasks using his right UE.    Time  4    Period  Weeks    Status  On-going               Plan - 11/07/18 0929    Clinical Impression Statement  A: Pt reporting MRI was negative for RC tear, MD gave injection at visit this week. Pt reporting minimal soreness/discomfort today during exercises. Continued with sidelying exercises and added 1# weight to standing exercises. Pt only reporting pain at 90 degree range and above. Added Y lift off and W arms today. Verbal cuing for form and technique.     Plan  P: Apply kinsiotape to right shoulder if needed for joint stability, pain, and weakness. Follow up on HEP. Add green therapy ball strengthening and  stability exercises       Patient will benefit from skilled therapeutic intervention in order to improve the following deficits and impairments:  Pain, Increased fascial restrictions, Decreased mobility, Decreased range of motion, Decreased strength, Impaired UE functional use  Visit Diagnosis: Other symptoms and signs involving the musculoskeletal system  Acute pain of right  shoulder  Stiffness of right shoulder, not elsewhere classified    Problem List Patient Active Problem List   Diagnosis Date Noted  . Fracture, metacarpal shaft 09/25/2012   Ezra Sites, OTR/L  [phone] 11/07/2018, 9:47 AM  [facility] [address] [place], [place], [address] Phone: [phone]   Fax:  [phone]  Name: Alex Moore MRN: [id number] Date of Birth: September 29, 1986

## 2018-11-10 ENCOUNTER — Telehealth (HOSPITAL_COMMUNITY): Payer: Self-pay | Admitting: Internal Medicine

## 2018-11-10 ENCOUNTER — Ambulatory Visit (HOSPITAL_COMMUNITY): Payer: BLUE CROSS/BLUE SHIELD | Admitting: Specialist

## 2018-11-10 NOTE — Telephone Encounter (Signed)
11/10/18  lady caller said to cx today because he is sick

## 2018-11-12 ENCOUNTER — Telehealth (HOSPITAL_COMMUNITY): Payer: Self-pay

## 2018-11-12 ENCOUNTER — Ambulatory Visit (HOSPITAL_COMMUNITY): Payer: BLUE CROSS/BLUE SHIELD

## 2018-11-12 NOTE — Telephone Encounter (Signed)
Patient is unable to make it today no reason was giving.

## 2018-11-17 ENCOUNTER — Encounter (HOSPITAL_COMMUNITY): Payer: Self-pay

## 2018-11-17 ENCOUNTER — Ambulatory Visit (HOSPITAL_COMMUNITY): Payer: BLUE CROSS/BLUE SHIELD

## 2018-11-17 ENCOUNTER — Telehealth (HOSPITAL_COMMUNITY): Payer: Self-pay

## 2018-11-17 DIAGNOSIS — R29898 Other symptoms and signs involving the musculoskeletal system: Secondary | ICD-10-CM

## 2018-11-17 DIAGNOSIS — M25511 Pain in right shoulder: Secondary | ICD-10-CM

## 2018-11-17 DIAGNOSIS — M25611 Stiffness of right shoulder, not elsewhere classified: Secondary | ICD-10-CM

## 2018-11-17 NOTE — Patient Instructions (Signed)
Complete the following 12-15 repetitions. 1-2x a day.   ELASTIC BAND SHOULDER EXTERNAL ROTATION - ER  While holding an elastic band at your side with your elbow bent, start with your hand near your stomach and then pull the band away. Keep your elbow at your side the entire time.     ELASTIC BAND SHOULDER ABDUCTION  While holding an elastic band at your side, draw up your arm to the side keeping your elbow straight.   ELASTIC BAND SHOULDER FLEXION  While holding an elastic band at your side, draw up your arm up in front of you keeping your elbow straight.

## 2018-11-17 NOTE — Telephone Encounter (Signed)
Co-worker MM sent request for medical records on Thursday 1/192020. Gave Mom phone number [phone]  to do a f/u call on the status. NF 11/17/2018

## 2018-11-17 NOTE — Therapy (Addendum)
[place], [place], [address] Phone: [phone]   Fax:  [phone]  Occupational Therapy Treatment Reassessment/re-cert Patient Details  Name: Alex Moore MRN: [id number] Date of Birth: [birth year] Referring Provider (OT): Sanjuana Kava, MD   Encounter Date: 11/17/2018  OT End of Session - 11/17/18 1583    Visit Number  5    Number of Visits  8    Date for OT Re-Evaluation  12/15/18    Authorization Type  [employer] - $25 co pay 30 visit limit - 1 used at time of evaluation. benefit period: (5/1-4/30)    Authorization - Visit Number  6    Authorization - Number of Visits  30    OT Start Time  1034    OT Stop Time  1115    OT Time Calculation (min)  41 min    Activity Tolerance  Patient tolerated treatment well    Behavior During Therapy  [facility] for tasks assessed/performed       History reviewed. No pertinent past medical history.  Past Surgical History:  Procedure Laterality Date  . KNEE SURGERY     left  . ORTHOPEDIC SURGERY      There were no vitals filed for this visit.  Subjective Assessment - 11/17/18 1237    Subjective   S: I can tell my range is better. It helps that I haven't been working because I feel if I was then my shoulder would be worse.     Patient is accompained by:  --   Significant other   Currently in Pain?  No/denies         [facility] OT Assessment - 11/17/18 1039      Assessment   Medical Diagnosis  right shoulder pain    Referring Provider (OT)  Sanjuana Kava, MD    Onset Date/Surgical Date  09/23/18      Precautions   Precautions  None      Prior Function   Level of Independence  Independent      ROM / Strength   AROM / PROM / Strength  AROM;PROM;Strength      Palpation   Palpation comment  min fascial restrictions in the right upper arm, trapezius, and scapularis region.       AROM   Overall AROM Comments  Assessed standing. IR/er abducted    AROM Assessment Site   Shoulder    Right/Left Shoulder  Right    Right Shoulder Flexion  150 Degrees   previous: 140   Right Shoulder ABduction  130 Degrees   previous: 125   Right Shoulder Internal Rotation  56 Degrees   preivous: 46   Right Shoulder External Rotation  80 Degrees   previous: 65     PROM   Overall PROM Comments  Assessed supine. IR/er abducted    PROM Assessment Site  Shoulder    Right/Left Shoulder  Right    Right Shoulder Flexion  170 Degrees   previous: 152   Right Shoulder ABduction  180 Degrees   previous: 137   Right Shoulder Internal Rotation  80 Degrees   previous: 40   Right Shoulder External Rotation  90 Degrees   previous: same     Strength   Overall Strength Comments  Assessed standing. IR/er abducted.    middle trapezius: 5/5 (previous: 4/5)   Strength Assessment Site  Shoulder    Right/Left Shoulder  Right    Right Shoulder Flexion  5/5  Right Shoulder ABduction  5/5    Right Shoulder Internal Rotation  5/5    Right Shoulder External Rotation  5/5               OT Treatments/Exercises (OP) - 11/17/18 1101      Exercises   Exercises  Shoulder      Shoulder Exercises: Supine   Protraction  PROM;5 reps    Horizontal ABduction  PROM;5 reps    External Rotation  PROM;5 reps   abducted   Internal Rotation  PROM;5 reps   abducted   Flexion  PROM;5 reps    ABduction  PROM;5 reps      Shoulder Exercises: Standing   External Rotation  Theraband;12 reps    Theraband Level (Shoulder External Rotation)  Level 3 (Green)    Flexion  Theraband;12 reps    Theraband Level (Shoulder Flexion)  Level 3 (Green)    ABduction  Theraband;12 reps    Theraband Level (Shoulder ABduction)  Level 3 (Green)    Extension  [employer] reps    Theraband Level (Shoulder Extension)  Level 3 (Green)    Row  [employer] reps    Theraband Level (Shoulder Row)  Level 3 (Green)    Retraction  Theraband;12 reps    Theraband Level (Shoulder Retraction)  Level 3 (Green)       Manual Therapy   Manual Therapy  Soft tissue mobilization    Manual therapy comments  completed separately from therapeutic exercises    Soft tissue mobilization  myofascial release to right deltoid, trapezius, and scapularis regions to decrease pain and fascial restrictions and increase joint range of motion             OT Education - 11/17/18 1241    Education Details  green theraband abduction, flexion, and external rotation    Person(s) Educated  Patient    Methods  Explanation;Demonstration;Verbal cues;Other (comment)   e-mailed handout   Comprehension  Verbalized understanding;Returned demonstration       OT Short Term Goals - 11/17/18 1055      OT SHORT TERM GOAL #1   Title  Patient will be educated and independent with his HEP to faciliate his progress in therapy and allow him to return to using his right arm during his daily and work related tasks at his highest level of independence.     Time  4    Period  Weeks    Status  Achieved      OT SHORT TERM GOAL #2   Title  Patient will increase his RUE A/ROM to WNL to increase his ability to reach overhead with more comfort.     Baseline  1/13: Patient is not WNL for abduction or internal rotation with arm abducted    Time  4    Period  Weeks    Status  On-going      OT SHORT TERM GOAL #3   Title  Patient will increase his right UE strength to 5/5 in order to complete the needed lifting tasks at work as well as care of his 2 y/o son with less difficulty.     Time  4    Period  Weeks    Status  Achieved      OT SHORT TERM GOAL #4   Title  Patient will report a pain level of 3/10 or less in his right shoulder while completing daily and work related tasks.     Baseline  1/13: 4 to  5/10 on average.     Time  4    Period  Weeks    Status  On-going      OT SHORT TERM GOAL #5   Title  Patient will decrease his right UE fascial restrictions to trace amount or less in order to increase his functional mobility and allow  him to complete all necessary daily tasks using his right UE.    Time  4    Period  Weeks    Status  On-going               Plan - 11/17/18 1249    Clinical Impression Statement  A: patient has met 2/5 therapy goals at this time. Patient recently completed a MRI to his RUE which came back negative. Patient reports that he is having to modify how he picks up his son as if he is not careful and does not think about it, he will experience shoulder pain. patient has 5/5 shoulder strength. ROM has increased. Conitnues to be lacking in full/equal ROM for shoulder flexion and external rotation. Pain continues to stop him from completing all activities comfortably. Pt continues to have fascial restrictions in the upper trapezius and anterior portion of his shoulder.     Plan  P: Continue OT services 2x/week for 4 more weeks focusing on the mentioned deficits. Follow up on HEP. HEP was e-mailed last session, as therapist forgot to provide a paper copy. Continue with scapular and shoulder stability exercises.     Consulted and Agree with Plan of Care  Patient       Patient will benefit from skilled therapeutic intervention in order to improve the following deficits and impairments:  Pain, Increased fascial restrictions, Decreased mobility, Decreased range of motion, Decreased strength, Impaired UE functional use  Visit Diagnosis: Other symptoms and signs involving the musculoskeletal system - Plan: Ot plan of care cert/re-cert  Acute pain of right shoulder - Plan: Ot plan of care cert/re-cert  Stiffness of right shoulder, not elsewhere classified - Plan: Ot plan of care cert/re-cert    Problem List Patient Active Problem List   Diagnosis Date Noted  . Fracture, metacarpal shaft 09/25/2012    Ailene Moore, OTR/L,CBIS  [phone]  11/17/2018, 12:55 PM  [facility] [address] [place], [place], [address] Phone: [phone]   Fax:   [phone]  Name: Alex Moore MRN: [id number] Date of Birth: [birth year]    OCCUPATIONAL THERAPY DISCHARGE SUMMARY (11/27/18)  Visits from Start of Care: 5  Current functional level related to goals / functional outcomes: Pt had follow up appointment with Dr. Luna Glasgow on 11/25/18. MD recommends that patient complete HEP at home independently and does not have to return to outpatient clinic to continue with shoulder.  See above for functional outcomes from treatment/reassessment on 11/17/18.   Remaining deficits: See above   Education / Equipment: See above Plan: Patient agrees to discharge.  Patient goals were partially met. Patient is being discharged due to the physician's request.  ?????         Ailene Moore, OTR/L,CBIS  [phone]

## 2018-11-19 ENCOUNTER — Ambulatory Visit (HOSPITAL_COMMUNITY): Payer: BLUE CROSS/BLUE SHIELD

## 2018-11-25 ENCOUNTER — Encounter: Payer: Self-pay | Admitting: Orthopaedic Surgery

## 2018-11-25 ENCOUNTER — Ambulatory Visit: Payer: BLUE CROSS/BLUE SHIELD | Admitting: Orthopaedic Surgery

## 2018-11-25 VITALS — BP 114/80 | HR 69 | Ht 68.0 in | Wt 154.0 lb

## 2018-11-25 DIAGNOSIS — G8929 Other chronic pain: Secondary | ICD-10-CM | POA: Diagnosis not present

## 2018-11-25 DIAGNOSIS — M25511 Pain in right shoulder: Secondary | ICD-10-CM | POA: Diagnosis not present

## 2018-11-25 NOTE — Progress Notes (Signed)
Patient Alex Moore, male DOB:1985-12-10, 33 y.o. JGO:[id number]  Chief Complaint  Patient presents with  . Shoulder Injury    Acute pain of right shoulder    HPI  Alex Moore is a 33 y.o. male who has right shoulder pain.  He has been going to PT and is improving.  His strength and motion is improved but his endurance is still low.  He needs to continue his exercises.  He can stop PT but do the exercises at home.  He has no new acute problem and is much improved.   Body mass index is 23.42 kg/m.  ROS  Review of Systems  Constitutional: Positive for activity change.  Musculoskeletal: Positive for arthralgias.  All other systems reviewed and are negative.   All other systems reviewed and are negative.  The following is a summary of the past history medically, past history surgically, known current medicines, social history and family history.  This information is gathered electronically by the computer from prior information and documentation.  I review this each visit and have found including this information at this point in the chart is beneficial and informative.    History reviewed. No pertinent past medical history.  Past Surgical History:  Procedure Laterality Date  . KNEE SURGERY     left  . ORTHOPEDIC SURGERY      Family History  Problem Relation Age of Onset  . Cancer Mother   . Cancer Maternal Grandmother   . Heart attack Maternal Grandfather   . Stroke Maternal Grandfather     Social History Social History   Tobacco Use  . Smoking status: Current Every Day Smoker    Packs/day: 0.50    Types: Cigarettes  . Smokeless tobacco: Never Used  Substance Use Topics  . Alcohol use: Yes    Alcohol/week: 1.0 standard drinks    Types: 1 Shots of liquor per week    Comment: occasional  . Drug use: Yes    Types: Marijuana    Comment: occasionally    No Known Allergies  Current Outpatient Medications  Medication Sig Dispense Refill  .  HYDROcodone-acetaminophen (NORCO/VICODIN) 5-325 MG tablet One tablet every four hours as needed for acute pain.  Limit of five days per  statue. (Patient not taking: Reported on 10/09/2018) 30 tablet 0  . ibuprofen (ADVIL,MOTRIN) 800 MG tablet Take 1 tablet (800 mg total) by mouth 3 (three) times daily. 21 tablet 0  . traMADol (ULTRAM) 50 MG tablet Take 1 tablet (50 mg total) by mouth every 6 (six) hours as needed. (Patient not taking: Reported on 10/23/2018) 10 tablet 0   No current facility-administered medications for this visit.      Physical Exam  Blood pressure 114/80, pulse 69, height 5\' 8"  (1.727 m), weight 154 lb (69.9 kg).  Constitutional: overall normal hygiene, normal nutrition, well developed, normal grooming, normal body habitus. Assistive device:none  Musculoskeletal: gait and station Limp none, muscle tone and strength are normal, no tremors or atrophy is present.  .  Neurological: coordination overall normal.  Deep tendon reflex/nerve stretch intact.  Sensation normal.  Cranial nerves II-XII intact.   Skin:   Normal overall no scars, lesions, ulcers or rashes. No psoriasis.  Psychiatric: Alert and oriented x 3.  Recent memory intact, remote memory unclear.  Normal mood and affect. Well groomed.  Good eye contact.  Cardiovascular: overall no swelling, no varicosities, no edema bilaterally, normal temperatures of the legs and arms, no clubbing, cyanosis and good capillary refill.  Lymphatic: palpation is normal.  He has full ROM of the right shoulder today and just slight tenderness.  NV intact. ROM of the neck is full.  All other systems reviewed and are negative   The patient has been educated about the nature of the problem(s) and counseled on treatment options.  The patient appeared to understand what I have discussed and is in agreement with it.  Encounter Diagnosis  Name Primary?  . Chronic pain in right shoulder Yes    PLAN Call if any problems.   Precautions discussed.  Continue current medications.   Return to clinic 1 month   He can stop PT.  Electronically Signed Darreld Mclean, MD 1/21/20209:47 AM

## 2018-12-04 ENCOUNTER — Encounter: Payer: Self-pay | Admitting: Orthopaedic Surgery

## 2018-12-04 ENCOUNTER — Ambulatory Visit: Payer: BLUE CROSS/BLUE SHIELD | Admitting: Orthopaedic Surgery

## 2018-12-04 VITALS — BP 116/66 | HR 61 | Ht 68.0 in | Wt 154.0 lb

## 2018-12-04 DIAGNOSIS — M25511 Pain in right shoulder: Secondary | ICD-10-CM | POA: Diagnosis not present

## 2018-12-04 DIAGNOSIS — F17210 Nicotine dependence, cigarettes, uncomplicated: Secondary | ICD-10-CM

## 2018-12-04 DIAGNOSIS — G8929 Other chronic pain: Secondary | ICD-10-CM

## 2018-12-04 NOTE — Patient Instructions (Signed)
Steps to Quit Smoking    Smoking tobacco can be bad for your health. It can also affect almost every organ in your body. Smoking puts you and people around you at risk for many serious long-lasting (chronic) diseases. Quitting smoking is hard, but it is one of the best things that you can do for your health. It is never too late to quit.  What are the benefits of quitting smoking?  When you quit smoking, you lower your risk for getting serious diseases and conditions. They can include:  · Lung cancer or lung disease.  · Heart disease.  · Stroke.  · Heart attack.  · Not being able to have children (infertility).  · Weak bones (osteoporosis) and broken bones (fractures).  If you have coughing, wheezing, and shortness of breath, those symptoms may get better when you quit. You may also get sick less often. If you are pregnant, quitting smoking can help to lower your chances of having a baby of low birth weight.  What can I do to help me quit smoking?  Talk with your doctor about what can help you quit smoking. Some things you can do (strategies) include:  · Quitting smoking totally, instead of slowly cutting back how much you smoke over a period of time.  · Going to in-person counseling. You are more likely to quit if you go to many counseling sessions.  · Using resources and support systems, such as:  ? Online chats with a counselor.  ? Phone quitlines.  ? Printed self-help materials.  ? Support groups or group counseling.  ? Text messaging programs.  ? Mobile phone apps or applications.  · Taking medicines. Some of these medicines may have nicotine in them. If you are pregnant or breastfeeding, do not take any medicines to quit smoking unless your doctor says it is okay. Talk with your doctor about counseling or other things that can help you.  Talk with your doctor about using more than one strategy at the same time, such as taking medicines while you are also going to in-person counseling. This can help make  quitting easier.  What things can I do to make it easier to quit?  Quitting smoking might feel very hard at first, but there is a lot that you can do to make it easier. Take these steps:  · Talk to your family and friends. Ask them to support and encourage you.  · Call phone quitlines, reach out to support groups, or work with a counselor.  · Ask people who smoke to not smoke around you.  · Avoid places that make you want (trigger) to smoke, such as:  ? Bars.  ? Parties.  ? Smoke-break areas at work.  · Spend time with people who do not smoke.  · Lower the stress in your life. Stress can make you want to smoke. Try these things to help your stress:  ? Getting regular exercise.  ? Deep-breathing exercises.  ? Yoga.  ? Meditating.  ? Doing a body scan. To do this, close your eyes, focus on one area of your body at a time from head to toe, and notice which parts of your body are tense. Try to relax the muscles in those areas.  · Download or buy apps on your mobile phone or tablet that can help you stick to your quit plan. There are many free apps, such as QuitGuide from the CDC (Centers for Disease Control and Prevention). You can find more   support from smokefree.gov and other websites.  This information is not intended to replace advice given to you by your health care provider. Make sure you discuss any questions you have with your health care provider.  Document Released: 08/18/2009 Document Revised: 06/19/2016 Document Reviewed: 03/08/2015  Elsevier Interactive Patient Education © 2019 Elsevier Inc[employer]

## 2018-12-04 NOTE — Progress Notes (Signed)
Patient ZO:XWRUEAVWU:Alex Moore, male DOB:Aug 07, 1986, 33 y.o. JWJ:[id number]  Chief Complaint  Patient presents with  . Shoulder Pain    right     HPI  Kathleen LimeDejanerio Gundy is a 33 y.o. male who has recurrence of right shoulder pain.  He has had MRI which was negative, he went to OT for the shoulder and had been doing exercises at home with the bands.  He did well at first but his pain has returned.  At work he can work most of his shift but towards the end of the shift he starts having right shoulder pain.  It feels as if the shoulder wants to move laterally when he is pulling something.  He has no numbness, he has no swelling, no redness, no new trauma.  He sleeps well.  He has some pain with abduction at times and other times he is fine.  He is tired of having the pain in the shoulder when he leaves work daily.     Body mass index is 23.42 kg/m.  ROS  Review of Systems  Constitutional: Positive for activity change.  Musculoskeletal: Positive for arthralgias.  All other systems reviewed and are negative.   All other systems reviewed and are negative.  The following is a summary of the past history medically, past history surgically, known current medicines, social history and family history.  This information is gathered electronically by the computer from prior information and documentation.  I review this each visit and have found including this information at this point in the chart is beneficial and informative.    History reviewed. No pertinent past medical history.  Past Surgical History:  Procedure Laterality Date  . KNEE SURGERY     left  . ORTHOPEDIC SURGERY      Family History  Problem Relation Age of Onset  . Cancer Mother   . Cancer Maternal Grandmother   . Heart attack Maternal Grandfather   . Stroke Maternal Grandfather     Social History Social History   Tobacco Use  . Smoking status: Current Every Day Smoker    Packs/day: 0.50    Types: Cigarettes  .  Smokeless tobacco: Never Used  Substance Use Topics  . Alcohol use: Yes    Alcohol/week: 1.0 standard drinks    Types: 1 Shots of liquor per week    Comment: occasional  . Drug use: Yes    Types: Marijuana    Comment: occasionally    No Known Allergies  Current Outpatient Medications  Medication Sig Dispense Refill  . HYDROcodone-acetaminophen (NORCO/VICODIN) 5-325 MG tablet One tablet every four hours as needed for acute pain.  Limit of five days per [place]. (Patient not taking: Reported on 10/09/2018) 30 tablet 0  . ibuprofen (ADVIL,MOTRIN) 800 MG tablet Take 1 tablet (800 mg total) by mouth 3 (three) times daily. (Patient not taking: Reported on 12/04/2018) 21 tablet 0  . traMADol (ULTRAM) 50 MG tablet Take 1 tablet (50 mg total) by mouth every 6 (six) hours as needed. (Patient not taking: Reported on 10/23/2018) 10 tablet 0   No current facility-administered medications for this visit.      Physical Exam  Blood pressure 116/66, pulse 61, height 5\' 8"  (1.727 m), weight 154 lb (69.9 kg).  Constitutional: overall normal hygiene, normal nutrition, well developed, normal grooming, normal body habitus. Assistive device:none  Musculoskeletal: gait and station Limp none, muscle tone and strength are normal, no tremors or atrophy is present.  .  Neurological: coordination overall  normal.  Deep tendon reflex/nerve stretch intact.  Sensation normal.  Cranial nerves II-XII intact.   Skin:   Normal overall no scars, lesions, ulcers or rashes. No psoriasis.  Psychiatric: Alert and oriented x 3.  Recent memory intact, remote memory unclear.  Normal mood and affect. Well groomed.  Good eye contact.  Cardiovascular: overall no swelling, no varicosities, no edema bilaterally, normal temperatures of the legs and arms, no clubbing, cyanosis and good capillary refill.  Lymphatic: palpation is normal.  The right shoulder has full ROM but tender, more with resisted abduction.  NV  intact.  He has no effusion.  Grips normal.  He has pain over the biceps tendon to deep palpation and with resisted biceps muscle activity.   All other systems reviewed and are negative   The patient has been educated about the nature of the problem(s) and counseled on treatment options.  The patient appeared to understand what I have discussed and is in agreement with it.  Encounter Diagnoses  Name Primary?  . Chronic pain in right shoulder Yes  . Cigarette nicotine dependence without complication    I feel he has biceps tendinitis.  Procedure:  After permission from the patient and prep of the right anterior shoulder, I injected the biceps tendon area proximally of the right shoulder by sterile technique tolerated well.  PLAN Call if any problems.  Precautions discussed.  He will get FLA form for me to indicate about his pain in the shoulder that gets worse intermittently .   Return to clinic 2 weeks   Electronically Signed Darreld Mclean, MD 1/30/202010:39 AM

## 2018-12-23 ENCOUNTER — Encounter: Payer: Self-pay | Admitting: Orthopaedic Surgery

## 2018-12-23 ENCOUNTER — Ambulatory Visit: Payer: BLUE CROSS/BLUE SHIELD | Admitting: Orthopaedic Surgery

## 2018-12-23 VITALS — BP 138/77 | HR 79 | Ht 68.0 in | Wt 153.0 lb

## 2018-12-23 DIAGNOSIS — F17210 Nicotine dependence, cigarettes, uncomplicated: Secondary | ICD-10-CM | POA: Diagnosis not present

## 2018-12-23 DIAGNOSIS — G8929 Other chronic pain: Secondary | ICD-10-CM | POA: Diagnosis not present

## 2018-12-23 DIAGNOSIS — M25511 Pain in right shoulder: Secondary | ICD-10-CM | POA: Diagnosis not present

## 2018-12-23 MED ORDER — PREDNISONE 5 MG (21) PO TBPK: ORAL_TABLET | ORAL | 0 refills | Status: DC

## 2018-12-23 MED ORDER — NAPROXEN 500 MG PO TABS: 500.0000 mg | ORAL_TABLET | Freq: Two times a day (BID) | ORAL | 5 refills | Status: DC

## 2018-12-23 NOTE — Progress Notes (Signed)
Patient SX:Alex Moore, male DOB:October 01, 1986, 33 y.o. MVV:[id number]  Chief Complaint  Patient presents with  . Shoulder Pain    right    HPI  Alex Moore is a 33 y.o. male who has chronic pain in the right shoulder. He has pain over the biceps area.  He has pain now after several hours at work.  He has pain with overhead use.  He is overall better but still has nagging pain.  He has no paresthesias.  I will give prednisone dose pack and then when done, he is to begin Naprosyn.  I will stop the ibuprofen.  Continue his exercises.   Body mass index is 23.26 kg/m.  ROS  Review of Systems  Constitutional: Positive for activity change.  Musculoskeletal: Positive for arthralgias.  All other systems reviewed and are negative.   All other systems reviewed and are negative.  The following is a summary of the past history medically, past history surgically, known current medicines, social history and family history.  This information is gathered electronically by the computer from prior information and documentation.  I review this each visit and have found including this information at this point in the chart is beneficial and informative.    History reviewed. No pertinent past medical history.  Past Surgical History:  Procedure Laterality Date  . KNEE SURGERY     left  . ORTHOPEDIC SURGERY      Family History  Problem Relation Age of Onset  . Cancer Mother   . Cancer Maternal Grandmother   . Heart attack Maternal Grandfather   . Stroke Maternal Grandfather     Social History Social History   Tobacco Use  . Smoking status: Current Every Day Smoker    Packs/day: 0.50    Types: Cigarettes  . Smokeless tobacco: Never Used  Substance Use Topics  . Alcohol use: Yes    Alcohol/week: 1.0 standard drinks    Types: 1 Shots of liquor per week    Comment: occasional  . Drug use: Yes    Types: Marijuana    Comment: occasionally    No Known Allergies  Current  Outpatient Medications  Medication Sig Dispense Refill  . HYDROcodone-acetaminophen (NORCO/VICODIN) 5-325 MG tablet One tablet every four hours as needed for acute pain.  Limit of five days per [place]. (Patient not taking: Reported on 10/09/2018) 30 tablet 0  . naproxen (NAPROSYN) 500 MG tablet Take 1 tablet (500 mg total) by mouth 2 (two) times daily with a meal. 60 tablet 5  . predniSONE (STERAPRED UNI-PAK 21 TAB) 5 MG (21) TBPK tablet Take 6 pills first day; 5 pills second day; 4 pills third day; 3 pills fourth day; 2 pills next day and 1 pill last day. 21 tablet 0  . traMADol (ULTRAM) 50 MG tablet Take 1 tablet (50 mg total) by mouth every 6 (six) hours as needed. (Patient not taking: Reported on 10/23/2018) 10 tablet 0   No current facility-administered medications for this visit.      Physical Exam  Blood pressure 138/77, pulse 79, height 5\' 8"  (1.727 m), weight 153 lb (69.4 kg).  Constitutional: overall normal hygiene, normal nutrition, well developed, normal grooming, normal body habitus. Assistive device:none  Musculoskeletal: gait and station Limp none, muscle tone and strength are normal, no tremors or atrophy is present.  .  Neurological: coordination overall normal.  Deep tendon reflex/nerve stretch intact.  Sensation normal.  Cranial nerves II-XII intact.   Skin:   Normal overall no  scars, lesions, ulcers or rashes. No psoriasis.  Psychiatric: Alert and oriented x 3.  Recent memory intact, remote memory unclear.  Normal mood and affect. Well groomed.  Good eye contact.  Cardiovascular: overall no swelling, no varicosities, no edema bilaterally, normal temperatures of the legs and arms, no clubbing, cyanosis and good capillary refill.  Lymphatic: palpation is normal.  ROM of the right shoulder is full. He has tenderness over the biceps tendon.  NV intact.  All other systems reviewed and are negative   The patient has been educated about the nature of the  problem(s) and counseled on treatment options.  The patient appeared to understand what I have discussed and is in agreement with it.  Encounter Diagnoses  Name Primary?  . Chronic pain in right shoulder Yes  . Cigarette nicotine dependence without complication     PLAN Call if any problems.  Precautions discussed.  Stop the ibuprofen and begin prednisone dose pack and then Naprosyn once the dose pack is over.  Return to clinic 3 weeks   Electronically Signed Darreld Mclean, MD 2/18/20209:49 AM

## 2018-12-23 NOTE — Patient Instructions (Signed)
Steps to Quit Smoking    Smoking tobacco can be bad for your health. It can also affect almost every organ in your body. Smoking puts you and people around you at risk for many serious long-lasting (chronic) diseases. Quitting smoking is hard, but it is one of the best things that you can do for your health. It is never too late to quit.  What are the benefits of quitting smoking?  When you quit smoking, you lower your risk for getting serious diseases and conditions. They can include:  · Lung cancer or lung disease.  · Heart disease.  · Stroke.  · Heart attack.  · Not being able to have children (infertility).  · Weak bones (osteoporosis) and broken bones (fractures).  If you have coughing, wheezing, and shortness of breath, those symptoms may get better when you quit. You may also get sick less often. If you are pregnant, quitting smoking can help to lower your chances of having a baby of low birth weight.  What can I do to help me quit smoking?  Talk with your doctor about what can help you quit smoking. Some things you can do (strategies) include:  · Quitting smoking totally, instead of slowly cutting back how much you smoke over a period of time.  · Going to in-person counseling. You are more likely to quit if you go to many counseling sessions.  · Using resources and support systems, such as:  ? Online chats with a counselor.  ? Phone quitlines.  ? Printed self-help materials.  ? Support groups or group counseling.  ? Text messaging programs.  ? Mobile phone apps or applications.  · Taking medicines. Some of these medicines may have nicotine in them. If you are pregnant or breastfeeding, do not take any medicines to quit smoking unless your doctor says it is okay. Talk with your doctor about counseling or other things that can help you.  Talk with your doctor about using more than one strategy at the same time, such as taking medicines while you are also going to in-person counseling. This can help make  quitting easier.  What things can I do to make it easier to quit?  Quitting smoking might feel very hard at first, but there is a lot that you can do to make it easier. Take these steps:  · Talk to your family and friends. Ask them to support and encourage you.  · Call phone quitlines, reach out to support groups, or work with a counselor.  · Ask people who smoke to not smoke around you.  · Avoid places that make you want (trigger) to smoke, such as:  ? Bars.  ? Parties.  ? Smoke-break areas at work.  · Spend time with people who do not smoke.  · Lower the stress in your life. Stress can make you want to smoke. Try these things to help your stress:  ? Getting regular exercise.  ? Deep-breathing exercises.  ? Yoga.  ? Meditating.  ? Doing a body scan. To do this, close your eyes, focus on one area of your body at a time from head to toe, and notice which parts of your body are tense. Try to relax the muscles in those areas.  · Download or buy apps on your mobile phone or tablet that can help you stick to your quit plan. There are many free apps, such as QuitGuide from the CDC (Centers for Disease Control and Prevention). You can find more   support from smokefree.gov and other websites.  This information is not intended to replace advice given to you by your health care provider. Make sure you discuss any questions you have with your health care provider.  Document Released: 08/18/2009 Document Revised: 06/19/2016 Document Reviewed: 03/08/2015  Elsevier Interactive Patient Education © 2019 Elsevier Inc[employer]

## 2018-12-24 ENCOUNTER — Encounter: Payer: Self-pay | Admitting: Orthopaedic Surgery

## 2019-01-13 ENCOUNTER — Ambulatory Visit: Payer: BLUE CROSS/BLUE SHIELD | Admitting: Orthopaedic Surgery

## 2019-01-14 ENCOUNTER — Encounter: Payer: Self-pay | Admitting: Orthopaedic Surgery

## 2019-04-25 ENCOUNTER — Other Ambulatory Visit: Payer: Self-pay

## 2019-04-25 ENCOUNTER — Emergency Department (HOSPITAL_COMMUNITY): Payer: BC Managed Care – PPO

## 2019-04-25 ENCOUNTER — Emergency Department (HOSPITAL_COMMUNITY)
Admission: EM | Admit: 2019-04-25 | Discharge: 2019-04-25 | Disposition: A | Discharge status: Home / Self Care | Payer: BC Managed Care – PPO | Attending: Emergency Medicine | Admitting: Emergency Medicine

## 2019-04-25 ENCOUNTER — Encounter (HOSPITAL_COMMUNITY): Payer: Self-pay

## 2019-04-25 DIAGNOSIS — Y92410 Unspecified street and highway as the place of occurrence of the external cause: Secondary | ICD-10-CM | POA: Diagnosis not present

## 2019-04-25 DIAGNOSIS — Y939 Activity, unspecified: Secondary | ICD-10-CM | POA: Insufficient documentation

## 2019-04-25 DIAGNOSIS — F17210 Nicotine dependence, cigarettes, uncomplicated: Secondary | ICD-10-CM | POA: Insufficient documentation

## 2019-04-25 DIAGNOSIS — S99921A Unspecified injury of right foot, initial encounter: Secondary | ICD-10-CM | POA: Diagnosis present

## 2019-04-25 DIAGNOSIS — Z23 Encounter for immunization: Secondary | ICD-10-CM | POA: Diagnosis not present

## 2019-04-25 DIAGNOSIS — S92131A Displaced fracture of posterior process of right talus, initial encounter for closed fracture: Secondary | ICD-10-CM

## 2019-04-25 DIAGNOSIS — Y999 Unspecified external cause status: Secondary | ICD-10-CM | POA: Diagnosis not present

## 2019-04-25 MED ORDER — TETANUS-DIPHTH-ACELL PERTUSSIS 5-2.5-18.5 LF-MCG/0.5 IM SUSP
0.5000 mL | Freq: Once | INTRAMUSCULAR | Status: AC
  Administered 2019-04-25: 0.5 mL via INTRAMUSCULAR
  Filled 2019-04-25: qty 0.5

## 2019-04-25 MED ORDER — HYDROCODONE-ACETAMINOPHEN 5-325 MG PO TABS
2.0000 | ORAL_TABLET | Freq: Once | ORAL | Status: AC
  Administered 2019-04-25: 2 via ORAL
  Filled 2019-04-25: qty 2

## 2019-04-25 MED ORDER — NAPROXEN 500 MG PO TABS: 500.0000 mg | ORAL_TABLET | Freq: Two times a day (BID) | ORAL | 0 refills | Status: DC

## 2019-04-25 MED ORDER — BACITRACIN ZINC 500 UNIT/GM EX OINT
1.0000 "application " | TOPICAL_OINTMENT | Freq: Two times a day (BID) | CUTANEOUS | Status: DC
  Administered 2019-04-25: 1 via TOPICAL
  Filled 2019-04-25: qty 1.8

## 2019-04-25 NOTE — ED Notes (Signed)
Patient transported to X-ray 

## 2019-04-25 NOTE — Discharge Instructions (Signed)
Your x-ray shows that you have a tiny fracture on the back of your foot bone called the talus.  This may not be surgical however you will need to follow-up with the orthopedic surgeon above, Dr. Aline Brochure, or the doctor of your choice to have further evaluation.  Given the level of pain that you have I would suggest that you use the splint and the crutches until you follow-up.  You may take naproxen, twice daily as needed for pain, keep your foot elevated, iced and avoid bearing weight on the foot until you are seen by the orthopedist in the clinic.  Thank you for letting us take care of you today!  Please obtain all of your results from medical records or have your doctors office obtain the results - share them with your doctor - you should be seen at your doctors office in the next 2 days. Call today to arrange your follow up. Take the medications as prescribed. Please review all of the medicines and only take them if you do not have an allergy to them. Please be aware that if you are taking birth control pills, taking other prescriptions, ESPECIALLY ANTIBIOTICS may make the birth control ineffective - if this is the case, either do not engage in sexual activity or use alternative methods of birth control such as condoms until you have finished the medicine and your family doctor says it is OK to restart them. If you are on a blood thinner such as COUMADIN, be aware that any other medicine that you take may cause the coumadin to either work too much, or not enough - you should have your coumadin level rechecked in next 7 days if this is the case.  ?  It is also a possibility that you have an allergic reaction to any of the medicines that you have been prescribed - Everybody reacts differently to medications and while MOST people have no trouble with most medicines, you may have a reaction such as nausea, vomiting, rash, swelling, shortness of breath. If this is the case, please stop taking the medicine  immediately and contact your physician.   If you were given a medication in the ED such as percocet, vicodin, or morphine, be aware that these medicines are sedating and may change your ability to take care of yourself adequately for several hours after being given this medicines - you should not drive or take care of small children if you were given this medicine in the Emergency Department or if you have been prescribed these types of medicines. ?   You should return to the ER IMMEDIATELY if you develop severe or worsening symptoms.
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
## 2019-04-25 NOTE — ED Notes (Signed)
ED Provider at bedside. 

## 2019-09-21 ENCOUNTER — Other Ambulatory Visit: Payer: Self-pay | Admitting: Cardiology

## 2019-09-21 DIAGNOSIS — Z20822 Contact with and (suspected) exposure to covid-19: Secondary | ICD-10-CM

## 2019-09-23 LAB — NOVEL CORONAVIRUS, NAA: SARS-CoV-2, NAA: NOT DETECTED

## 2019-10-08 ENCOUNTER — Other Ambulatory Visit: Payer: Self-pay

## 2019-10-08 DIAGNOSIS — Z20822 Contact with and (suspected) exposure to covid-19: Secondary | ICD-10-CM

## 2019-10-11 ENCOUNTER — Other Ambulatory Visit: Payer: Self-pay

## 2019-10-11 ENCOUNTER — Emergency Department (HOSPITAL_COMMUNITY)
Admission: EM | Admit: 2019-10-11 | Discharge: 2019-10-11 | Disposition: A | Discharge status: Home / Self Care | Payer: Self-pay | Attending: Emergency Medicine | Admitting: Emergency Medicine

## 2019-10-11 ENCOUNTER — Encounter (HOSPITAL_COMMUNITY): Payer: Self-pay | Admitting: Emergency Medicine

## 2019-10-11 DIAGNOSIS — R109 Unspecified abdominal pain: Secondary | ICD-10-CM | POA: Insufficient documentation

## 2019-10-11 DIAGNOSIS — Z5321 Procedure and treatment not carried out due to patient leaving prior to being seen by health care provider: Secondary | ICD-10-CM | POA: Insufficient documentation

## 2019-10-11 NOTE — ED Triage Notes (Signed)
Patient c/o left flank pain that started Thursday. Patient went to [facility] ED and had CT. Patient told kindey stones (41mm) but unsure how many. Patient given ibuprofen, zofran, and cipro. Patient denies any relief. Denies any fevers. Patient reports intermittent nausea and vomiting. Patient states pain is getting worse.

## 2019-10-12 LAB — NOVEL CORONAVIRUS, NAA: SARS-CoV-2, NAA: NOT DETECTED

## 2020-05-30 ENCOUNTER — Telehealth: Payer: Self-pay | Admitting: Orthopaedic Surgery

## 2020-05-30 NOTE — Telephone Encounter (Signed)
Call received from [employer], ph#[phone], verifying fax number to send request for records and also process in which records are release; relayed [facility] protocol.
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
## 2021-08-24 ENCOUNTER — Ambulatory Visit
Admission: EM | Admit: 2021-08-24 | Discharge: 2021-08-24 | Disposition: A | Discharge status: Home / Self Care | Payer: Self-pay | Attending: Emergency Medicine | Admitting: Emergency Medicine

## 2021-08-24 ENCOUNTER — Other Ambulatory Visit: Payer: Self-pay

## 2021-08-24 ENCOUNTER — Encounter: Payer: Self-pay | Admitting: Emergency Medicine

## 2021-08-24 DIAGNOSIS — J069 Acute upper respiratory infection, unspecified: Secondary | ICD-10-CM

## 2021-08-24 DIAGNOSIS — Z20822 Contact with and (suspected) exposure to covid-19: Secondary | ICD-10-CM

## 2021-08-24 MED ORDER — PREDNISONE 20 MG PO TABS: 40.0000 mg | ORAL_TABLET | Freq: Every day | ORAL | 0 refills | Status: AC

## 2021-08-24 MED ORDER — FLUTICASONE PROPIONATE 50 MCG/ACT NA SUSP
2.0000 | Freq: Every day | NASAL | 0 refills | Status: DC
Start: 2021-08-24 — End: 2024-06-17

## 2021-08-24 MED ORDER — BENZONATATE 200 MG PO CAPS: 200.0000 mg | ORAL_CAPSULE | Freq: Three times a day (TID) | ORAL | 0 refills | Status: DC | PRN

## 2021-08-24 MED ORDER — IBUPROFEN 600 MG PO TABS: 600.0000 mg | ORAL_TABLET | Freq: Four times a day (QID) | ORAL | 0 refills | Status: DC | PRN

## 2021-08-24 MED ORDER — ALBUTEROL SULFATE HFA 108 (90 BASE) MCG/ACT IN AERS: 1.0000 | INHALATION_SPRAY | RESPIRATORY_TRACT | 0 refills | Status: DC | PRN

## 2021-08-24 MED ORDER — AEROCHAMBER PLUS MISC: 2 refills | Status: DC

## 2021-08-24 NOTE — ED Provider Notes (Signed)
HPI  SUBJECTIVE:  Alex Moore is a 35 y.o. male who presents with 3 days of fatigue, headache, nasal congestion, yellowish rhinorrhea, cough productive of the same material as his rhinorrhea, sinus pain and pressure and chills.  No fevers, body aches, facial swelling, upper dental pain, loss of sense of smell or taste, wheezing, chest pain, shortness of breath.  He reports fatigue with exertion.  He had an episode of diarrhea yesterday.  No nausea, vomiting, abdominal pain.  No neck stiffness, photophobia, rash.  No known COVID or flu exposure.  He did not get the COVID or flu vaccines.  No antibiotics in the past month.  No antipyretic in the past 6 hours.  He is unable to sleep at night secondary to the cough.  He has tried increasing fluids and rest with improvement in his symptoms, symptoms are worse with exertion.  He is a smoker.  He has no history of pulmonary disease, diabetes, hypertension.  PMD: None.   History reviewed. No pertinent past medical history.  Past Surgical History:  Procedure Laterality Date   KNEE SURGERY     left   ORTHOPEDIC SURGERY      Family History  Problem Relation Age of Onset   Cancer Mother    Cancer Maternal Grandmother    Heart attack Maternal Grandfather    Stroke Maternal Grandfather     Social History   Tobacco Use   Smoking status: Former    Packs/day: 0.50    Types: Cigarettes    Quit date: 09/20/2019    Years since quitting: 1.9   Smokeless tobacco: Never  Vaping Use   Vaping Use: Never used  Substance Use Topics   Alcohol use: Not Currently    Alcohol/week: 1.0 standard drink    Types: 1 Shots of liquor per week   Drug use: Yes    Types: Marijuana    Comment: occasionally    No current facility-administered medications for this encounter.  Current Outpatient Medications:    albuterol (VENTOLIN HFA) 108 (90 Base) MCG/ACT inhaler, Inhale 1-2 puffs into the lungs every 4 (four) hours as needed for wheezing or shortness of  breath., Disp: 1 each, Rfl: 0   benzonatate (TESSALON) 200 MG capsule, Take 1 capsule (200 mg total) by mouth 3 (three) times daily as needed for cough., Disp: 30 capsule, Rfl: 0   fluticasone (FLONASE) 50 MCG/ACT nasal spray, Place 2 sprays into both nostrils daily., Disp: 16 g, Rfl: 0   ibuprofen (ADVIL) 600 MG tablet, Take 1 tablet (600 mg total) by mouth every 6 (six) hours as needed., Disp: 30 tablet, Rfl: 0   predniSONE (DELTASONE) 20 MG tablet, Take 2 tablets (40 mg total) by mouth daily with breakfast for 5 days., Disp: 10 tablet, Rfl: 0   Spacer/Aero-Holding Chambers (AEROCHAMBER PLUS) inhaler, Use with inhaler, Disp: 1 each, Rfl: 2  No Known Allergies   ROS  As noted in HPI.   Physical Exam  BP (!) 148/68 (BP Location: Right Arm)   Pulse 71   Temp 98.8 F (37.1 C) (Oral)   Resp 18   SpO2 95%    Constitutional: Well developed, well nourished, no acute distress Eyes: PERRL, EOMI, conjunctiva normal bilaterally HENT: Normocephalic, atraumatic,mucus membranes moist.  Erythematous swollen turbinates on the right.  Extensive nasal congestion on the right.  Normal nasal mucosa on the left.  No maxillary, frontal sinus tenderness.  No obvious facial swelling.  Normal tonsils without exudates.  No obvious postnasal drip. Neck:  No cervical adenopathy Respiratory: Clear to auscultation bilaterally, no rales, no wheezing, no rhonchi Cardiovascular: Normal rate and rhythm, no murmurs, no gallops, no rubs GI: Nondistended skin: No rash, skin intact Musculoskeletal: No edema, no tenderness, no deformities Neurologic: Alert & oriented x 3, CN III-XII grossly intact, no motor deficits, sensation grossly intact Psychiatric: Speech and behavior appropriate   ED Course   Medications - No data to display  Orders Placed This Encounter  Procedures   Covid-19, Flu A+B ([facility])    Standing Status:   Standing    Number of Occurrences:   1   No results found for this or any previous  visit (from the past 24 hour(s)). No results found. Results for orders placed or performed during the hospital encounter of 08/24/21  Covid-19, Flu A+B ([facility])   Specimen: Nasopharyngeal   Naso  Result Value Ref Range   SARS-CoV-2, NAA Not Detected Not Detected   Influenza A, NAA Not Detected Not Detected   Influenza B, NAA Not Detected Not Detected   Test Information: Comment    ED Clinical Impression  1. Viral upper respiratory tract infection with cough   2. Exposure to COVID-19 virus      ED Assessment/Plan  Patient will be candidate for antivirals if COVID is positive because he is not vaccinated.  Unfortunately, he is out of the treatment window for influenza.  Will send home with Tylenol/ibuprofen, Tessalon, Tussionex, and albuterol inhaler with a spacer.  Wait-and-see prednisone.  He will fill it if his COVID is negative.  There are no clear indications for antibiotics at this time.  We will have him follow-up with [facility].  Ordering assistance in finding a PMD.  We will also give him a work note for Friday and Monday.  He is not working the weekend.  [place] [facility]otic database reviewed for this patient, and feel that the risk/benefit ratio today is favorable for proceeding with a prescription for controlled substance.  No opiate prescriptions in 2 years.  COVID flu PCR negative.   Discussed MDM, treatment plan, and plan for follow-up with patient Discussed sn/sx that should prompt return to the ED. patient agrees with plan.   Meds ordered this encounter  Medications   albuterol (VENTOLIN HFA) 108 (90 Base) MCG/ACT inhaler    Sig: Inhale 1-2 puffs into the lungs every 4 (four) hours as needed for wheezing or shortness of breath.    Dispense:  1 each    Refill:  0   fluticasone (FLONASE) 50 MCG/ACT nasal spray    Sig: Place 2 sprays into both nostrils daily.    Dispense:  16 g    Refill:  0   Spacer/Aero-Holding Chambers (AEROCHAMBER PLUS) inhaler    Sig:  Use with inhaler    Dispense:  1 each    Refill:  2    Please educate patient on use   benzonatate (TESSALON) 200 MG capsule    Sig: Take 1 capsule (200 mg total) by mouth 3 (three) times daily as needed for cough.    Dispense:  30 capsule    Refill:  0   ibuprofen (ADVIL) 600 MG tablet    Sig: Take 1 tablet (600 mg total) by mouth every 6 (six) hours as needed.    Dispense:  30 tablet    Refill:  0   predniSONE (DELTASONE) 20 MG tablet    Sig: Take 2 tablets (40 mg total) by mouth daily with breakfast for 5 days.  Dispense:  10 tablet    Refill:  0      *This clinic note was created using Scientist, clinical (histocompatibility and immunogenetics). Therefore, there may be occasional mistakes despite careful proofreading. ?    Domenick Gong, MD 08/26/21 [phone]

## 2021-08-24 NOTE — Discharge Instructions (Addendum)
I will prescribe Molnupiravir if your COVID is positive.  It usually comes back positive, unfortunately you are out of the treatment window.  600 mg of ibuprofen combined with 1000 mg of Tylenol together 3-4 times a day as needed for pain, fever.  Get a thermometer to measure your temperature.  Tessalon for the cough during the day, Tussionex for the cough at night.  2 puffs from your albuterol inhaler using your spacer every 4-6 hours as needed for coughing, wheezing, shortness of breath.  Wait-and-see prescription of prednisone.  Fill if your COVID is negative and you are not getting better.

## 2021-08-24 NOTE — ED Triage Notes (Signed)
Headache, runny nose, productive cough with yellow sputum, no appetite and feels weak x 3 days.

## 2021-08-25 LAB — COVID-19, FLU A+B NAA
Influenza A, NAA: NOT DETECTED
Influenza B, NAA: NOT DETECTED
SARS-CoV-2, NAA: NOT DETECTED

## 2022-08-25 ENCOUNTER — Encounter: Payer: Self-pay | Admitting: Emergency Medicine

## 2022-08-25 ENCOUNTER — Ambulatory Visit
Admission: EM | Admit: 2022-08-25 | Discharge: 2022-08-25 | Disposition: A | Discharge status: Home / Self Care | Payer: BC Managed Care – PPO | Attending: Family Medicine | Admitting: Family Medicine

## 2022-08-25 ENCOUNTER — Other Ambulatory Visit: Payer: Self-pay

## 2022-08-25 DIAGNOSIS — Z20822 Contact with and (suspected) exposure to covid-19: Secondary | ICD-10-CM | POA: Diagnosis present

## 2022-08-25 LAB — RESP PANEL BY RT-PCR (FLU A&B, COVID) ARPGX2
Influenza A by PCR: NEGATIVE
Influenza B by PCR: NEGATIVE
SARS Coronavirus 2 by RT PCR: NEGATIVE

## 2022-08-25 NOTE — ED Provider Notes (Signed)
[facility]    CSN: [id number] Arrival date & time: 08/25/22  1329      History   Chief Complaint Chief Complaint  Patient presents with   Follow-up    HPI Alex Moore is a 36 y.o. male.   Presenting today requesting COVID-19 screening as a family member tested positive today.  Denies any symptoms at this time.    History reviewed. No pertinent past medical history.  Patient Active Problem List   Diagnosis Date Noted   Fracture, metacarpal shaft 09/25/2012    Past Surgical History:  Procedure Laterality Date   KNEE SURGERY     left   ORTHOPEDIC SURGERY         Home Medications    Prior to Admission medications   Medication Sig Start Date End Date Taking? Authorizing Provider  albuterol (VENTOLIN HFA) 108 (90 Base) MCG/ACT inhaler Inhale 1-2 puffs into the lungs every 4 (four) hours as needed for wheezing or shortness of breath. 08/24/21   Melynda Ripple, MD  benzonatate (TESSALON) 200 MG capsule Take 1 capsule (200 mg total) by mouth 3 (three) times daily as needed for cough. 08/24/21   Melynda Ripple, MD  fluticasone (FLONASE) 50 MCG/ACT nasal spray Place 2 sprays into both nostrils daily. 08/24/21   Melynda Ripple, MD  ibuprofen (ADVIL) 600 MG tablet Take 1 tablet (600 mg total) by mouth every 6 (six) hours as needed. 08/24/21   Melynda Ripple, MD  Spacer/Aero-Holding Chambers (AEROCHAMBER PLUS) inhaler Use with inhaler 08/24/21   Melynda Ripple, MD    Family History Family History  Problem Relation Age of Onset   Cancer Mother    Cancer Maternal Grandmother    Heart attack Maternal Grandfather    Stroke Maternal Grandfather     Social History Social History   Tobacco Use   Smoking status: Former    Packs/day: 0.50    Types: Cigarettes    Quit date: 09/20/2019    Years since quitting: 2.9   Smokeless tobacco: Never  Vaping Use   Vaping Use: Never used  Substance Use Topics   Alcohol use: Not Currently     Alcohol/week: 1.0 standard drink of alcohol    Types: 1 Shots of liquor per week   Drug use: Yes    Types: Marijuana    Comment: occasionally     Allergies   Patient has no known allergies.   Review of Systems Review of Systems Per HPI  Physical Exam Triage Vital Signs ED Triage Vitals  Enc Vitals Group     BP 08/25/22 1340 130/82     Pulse Rate 08/25/22 1340 72     Resp 08/25/22 1340 20     Temp 08/25/22 1340 98.6 F (37 C)     Temp Source 08/25/22 1340 Oral     SpO2 08/25/22 1340 95 %     Weight --      Height --      Head Circumference --      Peak Flow --      Pain Score 08/25/22 1341 0     Pain Loc --      Pain Edu? --      Excl. in [place]? --    No data found.  Updated Vital Signs BP 130/82 (BP Location: Right Arm)   Pulse 72   Temp 98.6 F (37 C) (Oral)   Resp 20   SpO2 95%   Visual Acuity Right Eye Distance:   Left Eye Distance:  Bilateral Distance:    Right Eye Near:   Left Eye Near:    Bilateral Near:     Physical Exam Vitals and nursing note reviewed.  Constitutional:      Appearance: Normal appearance.  HENT:     Head: Atraumatic.  Eyes:     Extraocular Movements: Extraocular movements intact.     Conjunctiva/sclera: Conjunctivae normal.  Cardiovascular:     Rate and Rhythm: Normal rate and regular rhythm.  Pulmonary:     Effort: Pulmonary effort is normal.     Breath sounds: Normal breath sounds.  Musculoskeletal:        General: Normal range of motion.     Cervical back: Normal range of motion and neck supple.  Skin:    General: Skin is warm and dry.  Neurological:     General: No focal deficit present.     Mental Status: He is oriented to person, place, and time.  Psychiatric:        Mood and Affect: Mood normal.        Thought Content: Thought content normal.        Judgment: Judgment normal.      UC Treatments / Results  Labs (all labs ordered are listed, but only abnormal results are displayed) Labs Reviewed  RESP  PANEL BY RT-PCR (FLU A&B, COVID) [id number]    EKG   Radiology No results found.  Procedures Procedures (including critical care time)  Medications Ordered in UC Medications - No data to display  Initial Impression / Assessment and Plan / UC Course  I have reviewed the triage vital signs and the nursing notes.  Pertinent labs & imaging results that were available during my care of the patient were reviewed by me and considered in my medical decision making (see chart for details).     Respiratory panel pending, discussed return precautions and supportive home care.  Final Clinical Impressions(s) / UC Diagnoses   Final diagnoses:  Exposure to COVID-19 virus   Discharge Instructions   None    ED Prescriptions   None    PDMP not reviewed this encounter.   Particia Nearing, [place] 08/25/22 1431

## 2022-08-25 NOTE — ED Triage Notes (Signed)
Pt reports was exposed to covid. Denies any symptoms or pain.

## 2022-11-07 ENCOUNTER — Other Ambulatory Visit (HOSPITAL_COMMUNITY): Payer: Self-pay | Admitting: Rehabilitation

## 2022-11-07 DIAGNOSIS — M47812 Spondylosis without myelopathy or radiculopathy, cervical region: Secondary | ICD-10-CM

## 2022-11-28 ENCOUNTER — Ambulatory Visit (HOSPITAL_COMMUNITY)
Admission: RE | Admit: 2022-11-28 | Discharge: 2022-11-28 | Disposition: A | Discharge status: Home / Self Care | Payer: BC Managed Care – PPO | Source: Ambulatory Visit | Attending: Rehabilitation | Admitting: Rehabilitation

## 2022-11-28 DIAGNOSIS — M47812 Spondylosis without myelopathy or radiculopathy, cervical region: Secondary | ICD-10-CM | POA: Insufficient documentation

## 2022-12-26 ENCOUNTER — Other Ambulatory Visit: Payer: Self-pay

## 2022-12-26 ENCOUNTER — Emergency Department (HOSPITAL_COMMUNITY)
Admission: EM | Admit: 2022-12-26 | Discharge: 2022-12-26 | Disposition: A | Discharge status: Home / Self Care | Payer: BC Managed Care – PPO | Attending: Emergency Medicine | Admitting: Emergency Medicine

## 2022-12-26 ENCOUNTER — Encounter (HOSPITAL_COMMUNITY): Payer: Self-pay | Admitting: *Deleted

## 2022-12-26 DIAGNOSIS — L049 Acute lymphadenitis, unspecified: Secondary | ICD-10-CM | POA: Insufficient documentation

## 2022-12-26 MED ORDER — CLINDAMYCIN HCL 150 MG PO CAPS: 300.0000 mg | ORAL_CAPSULE | Freq: Three times a day (TID) | ORAL | 0 refills | Status: AC

## 2022-12-26 MED ORDER — IBUPROFEN 600 MG PO TABS: 600.0000 mg | ORAL_TABLET | Freq: Three times a day (TID) | ORAL | 0 refills | Status: DC

## 2022-12-26 MED ORDER — IBUPROFEN 800 MG PO TABS
800.0000 mg | ORAL_TABLET | Freq: Once | ORAL | Status: AC
  Administered 2022-12-26: 800 mg via ORAL
  Filled 2022-12-26: qty 1

## 2022-12-26 MED ORDER — POVIDONE-IODINE 10 % EX SOLN
CUTANEOUS | Status: DC | PRN
  Filled 2022-12-26: qty 14.8

## 2022-12-26 MED ORDER — LIDOCAINE HCL (PF) 2 % IJ SOLN: 10.0000 mL | Freq: Once | INTRAMUSCULAR | Status: DC

## 2022-12-26 NOTE — Discharge Instructions (Addendum)
Take the entire course of the medications prescribed.  Lymph nodes help our bodies fight infection, sometimes they can get overwhelmed by bacteria and getting affected by themselves which we suspect is the reason for this swelling lymph node.  In addition to the medications, you can apply gentle heating pad for 15 to 20 minutes several times daily.

## 2022-12-26 NOTE — ED Triage Notes (Signed)
Pt with swelling and painful to left neck x 3 days.  Denies any fevers or N/V.  Denies any trouble with swallowing.

## 2022-12-26 NOTE — ED Provider Notes (Signed)
[facility] Provider Note   CSN: [id number] Arrival date & time: 12/26/22  1826     History  Chief Complaint  Patient presents with   Abscess    Alex Moore is a 37 y.o. male with no significant past medical history presenting for evaluation of a painful swelling at the left side of his neck which started 3 days ago.  He states he has had similar symptoms at the site in the past.  He had some dental problems several months ago including having his wisdom teeth removed.  At that time he had similar swelling in his dentist thought that it could be a tracking dental infection.  He was placed on antibiotics and the site resolved.  He denies fevers or chills, he has had no problems swallowing, he denies dental pain at this time, he can fully chew without any mouth or dental pain.  He has applied warm compresses to the site but is found no other alleviators for the symptoms.  The history is provided by the patient.       Home Medications Prior to Admission medications   Medication Sig Start Date End Date Taking? Authorizing Provider  clindamycin (CLEOCIN) 150 MG capsule Take 2 capsules (300 mg total) by mouth 3 (three) times daily for 10 days. 12/26/22 01/05/23 Yes , Almyra Free, PA-C  ibuprofen (ADVIL) 600 MG tablet Take 1 tablet (600 mg total) by mouth 3 (three) times daily. 12/26/22  Yes , Almyra Free, PA-C  albuterol (VENTOLIN HFA) 108 (90 Base) MCG/ACT inhaler Inhale 1-2 puffs into the lungs every 4 (four) hours as needed for wheezing or shortness of breath. 08/24/21   Melynda Ripple, MD  benzonatate (TESSALON) 200 MG capsule Take 1 capsule (200 mg total) by mouth 3 (three) times daily as needed for cough. 08/24/21   Melynda Ripple, MD  fluticasone (FLONASE) 50 MCG/ACT nasal spray Place 2 sprays into both nostrils daily. 08/24/21   Melynda Ripple, MD  Spacer/Aero-Holding Chambers (AEROCHAMBER PLUS) inhaler Use with inhaler 08/24/21    Melynda Ripple, MD      Allergies    Patient has no known allergies.    Review of Systems   Review of Systems  Constitutional:  Negative for chills and fever.  HENT:  Negative for congestion and sore throat.   Eyes: Negative.   Respiratory:  Negative for chest tightness and shortness of breath.   Cardiovascular:  Negative for chest pain.  Gastrointestinal:  Negative for abdominal pain and nausea.  Genitourinary: Negative.   Musculoskeletal:  Positive for neck pain. Negative for arthralgias and joint swelling.  Skin: Negative.  Negative for rash and wound.  Neurological:  Negative for dizziness, weakness, light-headedness, numbness and headaches.  Psychiatric/Behavioral: Negative.    All other systems reviewed and are negative.   Physical Exam Updated Vital Signs BP 114/79   Pulse 74   Temp 98 F (36.7 C) (Oral)   Resp 16   Ht 5' 8"$  (1.727 m)   Wt 64.9 kg   SpO2 97%   BMI 21.74 kg/m  Physical Exam Vitals and nursing note reviewed.  Constitutional:      Appearance: He is well-developed.  HENT:     Head: Normocephalic and atraumatic.     Right Ear: External ear normal.     Left Ear: External ear normal.     Ears:     Comments: No mastoid tenderness.    Nose: Nose normal.     Mouth/Throat:  Dentition: Normal dentition. No dental tenderness or gingival swelling.     Pharynx: Uvula midline.     Tonsils: No tonsillar abscesses.  Eyes:     Conjunctiva/sclera: Conjunctivae normal.  Cardiovascular:     Rate and Rhythm: Normal rate and regular rhythm.     Heart sounds: Normal heart sounds.  Pulmonary:     Effort: Pulmonary effort is normal.     Breath sounds: Normal breath sounds. No wheezing.  Abdominal:     General: Bowel sounds are normal.     Palpations: Abdomen is soft.     Tenderness: There is no abdominal tenderness.  Musculoskeletal:        General: Normal range of motion.     Cervical back: Normal range of motion.  Lymphadenopathy:     Head:      Left side of head: Submandibular adenopathy present.  Skin:    General: Skin is warm and dry.  Neurological:     Mental Status: He is alert.     ED Results / Procedures / Treatments   Labs (all labs ordered are listed, but only abnormal results are displayed) Labs Reviewed - No data to display  EKG None  Radiology No results found.  Procedures Procedures    Medications Ordered in ED Medications  povidone-iodine (BETADINE) 10 % external solution (has no administration in time range)  lidocaine HCl (PF) (XYLOCAINE) 2 % injection 10 mL (10 mLs Other Not Given 12/26/22 2257)  ibuprofen (ADVIL) tablet 800 mg (800 mg Oral Given 12/26/22 2117)    ED Course/ Medical Decision Making/ A&P                             Medical Decision Making Patient presenting with tender swelling at his left submandibular region.  Differential diagnoses including abscess versus infected lymph node.  Bedside ultrasound suggest this is a lymph node and not an abscess pocket.  He is placed on clindamycin, advised continued heat therapy.  Referral given to ENT, return precautions were outlined.  Risk Prescription drug management.           Final Clinical Impression(s) / ED Diagnoses Final diagnoses:  Lymphadenitis, acute    Rx / DC Orders ED Discharge Orders          Ordered    clindamycin (CLEOCIN) 150 MG capsule  3 times daily        12/26/22 2240    ibuprofen (ADVIL) 600 MG tablet  3 times daily        12/26/22 2240              Evalee Jefferson, Hershal Coria 12/26/22 2357    Godfrey Pick, MD 12/27/22 [phone]

## 2022-12-26 NOTE — ED Notes (Signed)
ED Provider at bedside. 

## 2022-12-26 NOTE — ED Notes (Addendum)
ED PA at bedside

## 2023-01-03 ENCOUNTER — Encounter: Payer: Self-pay | Admitting: Radiology

## 2023-06-13 ENCOUNTER — Emergency Department (HOSPITAL_COMMUNITY): Payer: Self-pay

## 2023-06-13 ENCOUNTER — Other Ambulatory Visit: Payer: Self-pay

## 2023-06-13 ENCOUNTER — Encounter (HOSPITAL_COMMUNITY): Payer: Self-pay

## 2023-06-13 ENCOUNTER — Emergency Department (HOSPITAL_COMMUNITY)
Admission: EM | Admit: 2023-06-13 | Discharge: 2023-06-13 | Disposition: A | Discharge status: Home / Self Care | Payer: Self-pay | Attending: Emergency Medicine | Admitting: Emergency Medicine

## 2023-06-13 DIAGNOSIS — D72829 Elevated white blood cell count, unspecified: Secondary | ICD-10-CM | POA: Insufficient documentation

## 2023-06-13 DIAGNOSIS — N132 Hydronephrosis with renal and ureteral calculous obstruction: Secondary | ICD-10-CM | POA: Insufficient documentation

## 2023-06-13 DIAGNOSIS — N201 Calculus of ureter: Secondary | ICD-10-CM

## 2023-06-13 LAB — URINALYSIS, ROUTINE W REFLEX MICROSCOPIC
Bacteria, UA: NONE SEEN
Bilirubin Urine: NEGATIVE
Glucose, UA: NEGATIVE mg/dL
Ketones, ur: NEGATIVE mg/dL
Leukocytes,Ua: NEGATIVE
Nitrite: NEGATIVE
Protein, ur: NEGATIVE mg/dL
Specific Gravity, Urine: 1.013 (ref 1.005–1.030)
pH: 7 (ref 5.0–8.0)

## 2023-06-13 LAB — CBC WITH DIFFERENTIAL/PLATELET
Abs Immature Granulocytes: 0.09 10*3/uL — ABNORMAL HIGH (ref 0.00–0.07)
Basophils Absolute: 0.1 10*3/uL (ref 0.0–0.1)
Basophils Relative: 1 %
Eosinophils Absolute: 0 10*3/uL (ref 0.0–0.5)
Eosinophils Relative: 0 %
HCT: 45.6 % (ref 39.0–52.0)
Hemoglobin: 15 g/dL (ref 13.0–17.0)
Immature Granulocytes: 1 %
Lymphocytes Relative: 9 %
Lymphs Abs: 1.5 10*3/uL (ref 0.7–4.0)
MCH: 31.6 pg (ref 26.0–34.0)
MCHC: 32.9 g/dL (ref 30.0–36.0)
MCV: 96.2 fL (ref 80.0–100.0)
Monocytes Absolute: 0.6 10*3/uL (ref 0.1–1.0)
Monocytes Relative: 4 %
Neutro Abs: 15.1 10*3/uL — ABNORMAL HIGH (ref 1.7–7.7)
Neutrophils Relative %: 85 %
Platelets: 226 10*3/uL (ref 150–400)
RBC: 4.74 MIL/uL (ref 4.22–5.81)
RDW: 13.6 % (ref 11.5–15.5)
WBC: 17.4 10*3/uL — ABNORMAL HIGH (ref 4.0–10.5)
nRBC: 0 % (ref 0.0–0.2)

## 2023-06-13 LAB — COMPREHENSIVE METABOLIC PANEL
ALT: 16 U/L (ref 0–44)
AST: 22 U/L (ref 15–41)
Albumin: 4.9 g/dL (ref 3.5–5.0)
Alkaline Phosphatase: 57 U/L (ref 38–126)
Anion gap: 11 (ref 5–15)
BUN: 12 mg/dL (ref 6–20)
CO2: 26 mmol/L (ref 22–32)
Calcium: 9.8 mg/dL (ref 8.9–10.3)
Chloride: 101 mmol/L (ref 98–111)
Creatinine, Ser: 1.03 mg/dL (ref 0.61–1.24)
GFR, Estimated: >60 mL/min (ref >60)
Glucose, Bld: 100 mg/dL — ABNORMAL HIGH (ref 70–99)
Potassium: 3.9 mmol/L (ref 3.5–5.1)
Sodium: 138 mmol/L (ref 135–145)
Total Bilirubin: 0.3 mg/dL (ref 0.3–1.2)
Total Protein: 8.2 g/dL — ABNORMAL HIGH (ref 6.5–8.1)

## 2023-06-13 LAB — LIPASE, BLOOD: Lipase: 25 U/L (ref 11–51)

## 2023-06-13 MED ORDER — KETOROLAC TROMETHAMINE 15 MG/ML IJ SOLN
15.0000 mg | Freq: Once | INTRAMUSCULAR | Status: AC
  Administered 2023-06-13: 15 mg via INTRAVENOUS
  Filled 2023-06-13: qty 1

## 2023-06-13 MED ORDER — ONDANSETRON HCL 4 MG/2ML IJ SOLN
4.0000 mg | Freq: Once | INTRAMUSCULAR | Status: AC
  Administered 2023-06-13: 4 mg via INTRAVENOUS
  Filled 2023-06-13: qty 2

## 2023-06-13 MED ORDER — IBUPROFEN 600 MG PO TABS: 600.0000 mg | ORAL_TABLET | Freq: Four times a day (QID) | ORAL | 0 refills | Status: DC | PRN

## 2023-06-13 MED ORDER — FENTANYL CITRATE PF 50 MCG/ML IJ SOSY
50.0000 ug | PREFILLED_SYRINGE | Freq: Once | INTRAMUSCULAR | Status: AC
  Administered 2023-06-13: 50 ug via INTRAVENOUS
  Filled 2023-06-13: qty 1

## 2023-06-13 MED ORDER — ONDANSETRON 4 MG PO TBDP: 4.0000 mg | ORAL_TABLET | Freq: Four times a day (QID) | ORAL | 0 refills | Status: DC | PRN

## 2023-06-13 MED ORDER — IOHEXOL 300 MG/ML  SOLN
75.0000 mL | Freq: Once | INTRAMUSCULAR | Status: AC | PRN
  Administered 2023-06-13: 100 mL via INTRAVENOUS

## 2023-06-13 MED ORDER — HYDROCODONE-ACETAMINOPHEN 5-325 MG PO TABS: 1.0000 | ORAL_TABLET | Freq: Four times a day (QID) | ORAL | 0 refills | Status: DC | PRN

## 2023-06-13 NOTE — ED Triage Notes (Signed)
Pt reports  Abdomen pain Right side Started last night Pain has increased to a level 10 Constant pain

## 2023-06-13 NOTE — Discharge Instructions (Signed)
Pleasure taking care of you today.  You have a 7 mm kidney stone on the right side which is the cause of your symptoms.  You do not have signs of a UTI, your other blood work was reassuring.  You have a high white blood cell count but there is no source of infection.  We are going to treat with pain medicine and nausea medicine.  You need to follow-up with the urologist.  If this does not pass on its own you may need a procedure.  If you develop severe pain, fever, vomiting or any other new or worsening symptoms please come back to the ER right away. Make sure you drink plenty of water

## 2023-06-13 NOTE — ED Provider Notes (Signed)
[place] EMERGENCY DEPARTMENT AT [facility] Provider Note   CSN: [id number] Arrival date & time: 06/13/23  1316     History  Chief Complaint  Patient presents with   Abdominal Pain    Alex Moore is a 37 y.o. male.  Denies any significant PMH.  Presents the ER complaining of abdominal pain, it started last night, and they are right mid abdomen, is gradually worsened.  States it has been severe since around 4:30 AM.  He has not eaten today but states he does feel hungry.  No diarrhea but he has mild nausea.  No fevers or chills.  No abdominal surgeries.  NO Testicular pain.   Abdominal Pain      Home Medications Prior to Admission medications   Medication Sig Start Date End Date Taking? Authorizing Provider  HYDROcodone-acetaminophen (NORCO) 5-325 MG tablet Take 1 tablet by mouth every 6 (six) hours as needed for moderate pain. 06/13/23  Yes ,  A, PA-C  ibuprofen (ADVIL) 600 MG tablet Take 1 tablet (600 mg total) by mouth every 6 (six) hours as needed. 06/13/23  Yes ,  A, PA-C  ondansetron (ZOFRAN-ODT) 4 MG disintegrating tablet Take 1 tablet (4 mg total) by mouth every 6 (six) hours as needed for nausea or vomiting. 06/13/23  Yes ,  A, PA-C  albuterol (VENTOLIN HFA) 108 (90 Base) MCG/ACT inhaler Inhale 1-2 puffs into the lungs every 4 (four) hours as needed for wheezing or shortness of breath. 08/24/21   Domenick Gong, MD  benzonatate (TESSALON) 200 MG capsule Take 1 capsule (200 mg total) by mouth 3 (three) times daily as needed for cough. 08/24/21   Domenick Gong, MD  fluticasone (FLONASE) 50 MCG/ACT nasal spray Place 2 sprays into both nostrils daily. 08/24/21   Domenick Gong, MD  ibuprofen (ADVIL) 600 MG tablet Take 1 tablet (600 mg total) by mouth 3 (three) times daily. 12/26/22   Burgess Amor, PA-C  Spacer/Aero-Holding Chambers (AEROCHAMBER PLUS) inhaler Use with inhaler 08/24/21   Domenick Gong, MD       Allergies    Patient has no known allergies.    Review of Systems   Review of Systems  Gastrointestinal:  Positive for abdominal pain.    Physical Exam Updated Vital Signs BP 124/74 (BP Location: Right Arm)   Pulse 70   Temp 98.6 F (37 C) (Oral)   Resp 18   Ht 5\' 8"  (1.727 m)   Wt 63.5 kg   SpO2 99%   BMI 21.29 kg/m  Physical Exam Vitals and nursing note reviewed.  Constitutional:      General: He is not in acute distress.    Appearance: He is well-developed.  HENT:     Head: Normocephalic and atraumatic.  Eyes:     Conjunctiva/sclera: Conjunctivae normal.  Cardiovascular:     Rate and Rhythm: Normal rate and regular rhythm.     Heart sounds: No murmur heard. Pulmonary:     Effort: Pulmonary effort is normal. No respiratory distress.     Breath sounds: Normal breath sounds.  Abdominal:     Palpations: Abdomen is soft.     Tenderness: There is no abdominal tenderness. There is no guarding or rebound.  Musculoskeletal:        General: No swelling.     Cervical back: Neck supple.  Skin:    General: Skin is warm and dry.     Capillary Refill: Capillary refill takes less than 2 seconds.  Neurological:  General: No focal deficit present.     Mental Status: He is alert.  Psychiatric:        Mood and Affect: Mood normal.     ED Results / Procedures / Treatments   Labs (all labs ordered are listed, but only abnormal results are displayed) Labs Reviewed  CBC WITH DIFFERENTIAL/PLATELET - Abnormal; Notable for the following components:      Result Value   WBC 17.4 (*)    Neutro Abs 15.1 (*)    Abs Immature Granulocytes 0.09 (*)    All other components within normal limits  COMPREHENSIVE METABOLIC PANEL - Abnormal; Notable for the following components:   Glucose, Bld 100 (*)    Total Protein 8.2 (*)    All other components within normal limits  URINALYSIS, ROUTINE W REFLEX MICROSCOPIC - Abnormal; Notable for the following components:   Hgb urine dipstick  SMALL (*)    All other components within normal limits  LIPASE, BLOOD    EKG None  Radiology CT ABDOMEN PELVIS W CONTRAST  Result Date: 06/13/2023 CLINICAL DATA:  Acute right flank pain. EXAM: CT ABDOMEN AND PELVIS WITH CONTRAST TECHNIQUE: Multidetector CT imaging of the abdomen and pelvis was performed using the standard protocol following bolus administration of intravenous contrast. RADIATION DOSE REDUCTION: This exam was performed according to the departmental dose-optimization program which includes automated exposure control, adjustment of the mA and/or kV according to patient size and/or use of iterative reconstruction technique. CONTRAST:  OMNIPAQUE IOHEXOL 300 MG/ML  SOLN COMPARISON:  None Available. FINDINGS: Lower chest: No acute abnormality. Hepatobiliary: No focal liver abnormality is seen. No gallstones, gallbladder wall thickening, or biliary dilatation. Pancreas: Unremarkable. No pancreatic ductal dilatation or surrounding inflammatory changes. Spleen: Normal in size without focal abnormality. Adrenals/Urinary Tract: Adrenal glands appear normal. Small nonobstructive right renal calculus is noted. Mild right hydronephrosis is noted secondary to 7 mm calculus in proximal right ureter. Small nonobstructive left renal calculus is noted as well. Urinary bladder is unremarkable. Stomach/Bowel: Stomach is within normal limits. Appendix appears normal. No evidence of bowel wall thickening, distention, or inflammatory changes. Vascular/Lymphatic: No significant vascular findings are present. No enlarged abdominal or pelvic lymph nodes. Reproductive: Prostate is unremarkable. Other: No abdominal wall hernia or abnormality. No abdominopelvic ascites. Musculoskeletal: No acute or significant osseous findings. IMPRESSION: Bilateral nephrolithiasis. Mild right hydronephrosis is noted secondary to 7 mm calculus in proximal right ureter. Electronically Signed   By: Lupita Raider M.D.   On:  06/13/2023 16:00    Procedures Procedures    Medications Ordered in ED Medications  fentaNYL (SUBLIMAZE) injection 50 mcg (50 mcg Intravenous Given 06/13/23 1413)  ondansetron (ZOFRAN) injection 4 mg (4 mg Intravenous Given 06/13/23 1413)  iohexol (OMNIPAQUE) 300 MG/ML solution 75 mL (100 mLs Intravenous Contrast Given 06/13/23 1533)  ketorolac (TORADOL) 15 MG/ML injection 15 mg (15 mg Intravenous Given 06/13/23 1717)    ED Course/ Medical Decision Making/ A&P Clinical Course as of 06/13/23 1750  Thu Jun 13, 2023  1511 CBC with Differential(!) [CB]    Clinical Course User Index [CB] Ma Rings, PA-C                                 Medical Decision Making Amount and/or Complexity of Data Reviewed Labs: ordered.    Details: CBC shows leukocytosis of 17 with left shift, CMP reassuring, lipase is normal, urinalysis shows a small hemoglobin with  no UTI Radiology: ordered and independent interpretation performed. Decision-making details documented in ED Course.  Risk Prescription drug management.   ED course: Patient presents to the ER with right mid abdominal pain since last night, still has appetite, no vomiting or diarrhea with mild nausea.  He does have a history of kidney stones in the past.  He had some right lower quadrant tenderness on exam along with a leukocytosis so CT scan was ordered for possible appendicitis, also consider diagnosis of cholecystitis or renal stone.  CT ordered and interpreted by me, shows a 7 mm right ureteral stone with mild hydronephrosis, stone is more proximal.  I agree with radiology read.  No other acute findings.  He was given fentanyl and Zofran for pain and nausea and had moderately for his pain, given Toradol after reevaluation for complete relief.  He had no vomiting.  He is comfortable in appearance with reassuring vitals.  He has had kidney stone in the past but never had urology follow-up so was encouraged.  He is advised to call tomorrow to  make an appointment.  He sent home with analgesics and Zofran for nausea.  Advised if his pain becomes worse and is not controlled with pain medicine at home he develops fever, persistent vomiting or any other worrisome changes return to the ER.        Final Clinical Impression(s) / ED Diagnoses Final diagnoses:  Ureterolithiasis    Rx / DC Orders ED Discharge Orders          Ordered    HYDROcodone-acetaminophen (NORCO) 5-325 MG tablet  Every 6 hours PRN        06/13/23 1649    ondansetron (ZOFRAN-ODT) 4 MG disintegrating tablet  Every 6 hours PRN        06/13/23 1649    ibuprofen (ADVIL) 600 MG tablet  Every 6 hours PRN        06/13/23 [address] PA-C 06/13/23 1750    Pricilla Loveless, MD 06/15/23 1500

## 2023-07-05 ENCOUNTER — Encounter (HOSPITAL_COMMUNITY): Payer: Self-pay

## 2023-07-05 ENCOUNTER — Emergency Department (HOSPITAL_COMMUNITY): Payer: Self-pay

## 2023-07-05 ENCOUNTER — Emergency Department (HOSPITAL_COMMUNITY)
Admission: EM | Admit: 2023-07-05 | Discharge: 2023-07-05 | Disposition: A | Discharge status: Home / Self Care | Payer: Self-pay

## 2023-07-05 ENCOUNTER — Other Ambulatory Visit: Payer: Self-pay

## 2023-07-05 DIAGNOSIS — N132 Hydronephrosis with renal and ureteral calculous obstruction: Secondary | ICD-10-CM | POA: Insufficient documentation

## 2023-07-05 DIAGNOSIS — N200 Calculus of kidney: Secondary | ICD-10-CM

## 2023-07-05 LAB — URINALYSIS, ROUTINE W REFLEX MICROSCOPIC
Bilirubin Urine: NEGATIVE
Glucose, UA: NEGATIVE mg/dL
Hgb urine dipstick: NEGATIVE
Ketones, ur: NEGATIVE mg/dL
Leukocytes,Ua: NEGATIVE
Nitrite: NEGATIVE
Protein, ur: NEGATIVE mg/dL
Specific Gravity, Urine: 1.017 (ref 1.005–1.030)
pH: 7 (ref 5.0–8.0)

## 2023-07-05 LAB — CBC
HCT: 43.2 % (ref 39.0–52.0)
Hemoglobin: 14.5 g/dL (ref 13.0–17.0)
MCH: 32.2 pg (ref 26.0–34.0)
MCHC: 33.6 g/dL (ref 30.0–36.0)
MCV: 95.8 fL (ref 80.0–100.0)
Platelets: 229 10*3/uL (ref 150–400)
RBC: 4.51 MIL/uL (ref 4.22–5.81)
RDW: 13.9 % (ref 11.5–15.5)
WBC: 8.9 10*3/uL (ref 4.0–10.5)
nRBC: 0 % (ref 0.0–0.2)

## 2023-07-05 LAB — BASIC METABOLIC PANEL
Anion gap: 9 (ref 5–15)
BUN: 14 mg/dL (ref 6–20)
CO2: 27 mmol/L (ref 22–32)
Calcium: 9.2 mg/dL (ref 8.9–10.3)
Chloride: 105 mmol/L (ref 98–111)
Creatinine, Ser: 1 mg/dL (ref 0.61–1.24)
GFR, Estimated: >60 mL/min (ref >60)
Glucose, Bld: 108 mg/dL — ABNORMAL HIGH (ref 70–99)
Potassium: 4.3 mmol/L (ref 3.5–5.1)
Sodium: 141 mmol/L (ref 135–145)

## 2023-07-05 MED ORDER — ONDANSETRON 4 MG PO TBDP
4.0000 mg | ORAL_TABLET | Freq: Once | ORAL | Status: AC
  Administered 2023-07-05: 4 mg via ORAL
  Filled 2023-07-05: qty 1

## 2023-07-05 MED ORDER — ONDANSETRON HCL 4 MG PO TABS: 4.0000 mg | ORAL_TABLET | Freq: Four times a day (QID) | ORAL | 0 refills | Status: DC

## 2023-07-05 MED ORDER — OXYCODONE-ACETAMINOPHEN 5-325 MG PO TABS
1.0000 | ORAL_TABLET | Freq: Once | ORAL | Status: AC
  Administered 2023-07-05: 1 via ORAL
  Filled 2023-07-05: qty 1

## 2023-07-05 MED ORDER — OXYCODONE-ACETAMINOPHEN 5-325 MG PO TABS: 1.0000 | ORAL_TABLET | Freq: Four times a day (QID) | ORAL | 0 refills | Status: AC | PRN

## 2023-07-05 NOTE — ED Triage Notes (Signed)
Pt recently dx'ed with kidney stone on RIGHT side.   Pt has not been able to get in with urology.  RIGHT sided ABD pain all day. Denies fevers.   Denies n/v/d Urinating normally denies blood in urine.

## 2023-07-05 NOTE — ED Provider Notes (Signed)
[place] EMERGENCY DEPARTMENT AT [facility] Provider Note   CSN: [id number] Arrival date & time: 07/05/23  1724     History  Chief Complaint  Patient presents with   Flank Pain    Alex Moore is a 37 y.o. male.  Presenting emergency department right sided abdominal pain similar in nature and character to pain of right sided stone that was found earlier this month.  He reports that he was diagnosed with a kidney stone feels exactly the same.  He had some pain for several days but then pain resolved.  He thought he passed stone.  Symptoms returned again.  No fevers, chills, chest pain, shortness of breath, no nausea no vomiting.  No dysuria.  No hematuria.   Flank Pain       Home Medications Prior to Admission medications   Medication Sig Start Date End Date Taking? Authorizing Provider  albuterol (VENTOLIN HFA) 108 (90 Base) MCG/ACT inhaler Inhale 1-2 puffs into the lungs every 4 (four) hours as needed for wheezing or shortness of breath. 08/24/21   Domenick Gong, MD  benzonatate (TESSALON) 200 MG capsule Take 1 capsule (200 mg total) by mouth 3 (three) times daily as needed for cough. 08/24/21   Domenick Gong, MD  fluticasone (FLONASE) 50 MCG/ACT nasal spray Place 2 sprays into both nostrils daily. 08/24/21   Domenick Gong, MD  HYDROcodone-acetaminophen (NORCO) 5-325 MG tablet Take 1 tablet by mouth every 6 (six) hours as needed for moderate pain. 06/13/23   Carmel Sacramento A, PA-C  ibuprofen (ADVIL) 600 MG tablet Take 1 tablet (600 mg total) by mouth 3 (three) times daily. 12/26/22   Burgess Amor, PA-C  ibuprofen (ADVIL) 600 MG tablet Take 1 tablet (600 mg total) by mouth every 6 (six) hours as needed. 06/13/23   Carmel Sacramento A, PA-C  ondansetron (ZOFRAN-ODT) 4 MG disintegrating tablet Take 1 tablet (4 mg total) by mouth every 6 (six) hours as needed for nausea or vomiting. 06/13/23   Carmel Sacramento A, PA-C  Spacer/Aero-Holding Chambers (AEROCHAMBER  PLUS) inhaler Use with inhaler 08/24/21   Domenick Gong, MD      Allergies    Patient has no known allergies.    Review of Systems   Review of Systems  Genitourinary:  Positive for flank pain.    Physical Exam Updated Vital Signs BP 128/74 (BP Location: Right Arm)   Pulse (!) 59   Temp 98.2 F (36.8 C) (Tympanic)   Resp 14   Ht 5\' 8"  (1.727 m)   Wt 65.8 kg   SpO2 94%   BMI 22.05 kg/m  Physical Exam Vitals and nursing note reviewed.  HENT:     Head: Normocephalic.     Mouth/Throat:     Mouth: Mucous membranes are moist.  Eyes:     Conjunctiva/sclera: Conjunctivae normal.  Cardiovascular:     Rate and Rhythm: Normal rate and regular rhythm.  Pulmonary:     Effort: Pulmonary effort is normal.  Abdominal:     General: Abdomen is flat. There is no distension.     Tenderness: There is abdominal tenderness (mild right flank). There is no guarding or rebound.  Musculoskeletal:        General: Normal range of motion.  Skin:    General: Skin is warm and dry.     Capillary Refill: Capillary refill takes less than 2 seconds.  Neurological:     Mental Status: He is alert and oriented to person, place, and time.  Psychiatric:  Mood and Affect: Mood normal.        Behavior: Behavior normal.     ED Results / Procedures / Treatments   Labs (all labs ordered are listed, but only abnormal results are displayed) Labs Reviewed  BASIC METABOLIC PANEL - Abnormal; Notable for the following components:      Result Value   Glucose, Bld 108 (*)    All other components within normal limits  URINALYSIS, ROUTINE W REFLEX MICROSCOPIC  CBC    EKG None  Radiology CT Renal Stone Study  Result Date: 07/05/2023 CLINICAL DATA:  Abdominal pain, flank pain EXAM: CT ABDOMEN AND PELVIS WITHOUT CONTRAST TECHNIQUE: Multidetector CT imaging of the abdomen and pelvis was performed following the standard protocol without IV contrast. RADIATION DOSE REDUCTION: This exam was performed  according to the departmental dose-optimization program which includes automated exposure control, adjustment of the mA and/or kV according to patient size and/or use of iterative reconstruction technique. COMPARISON:  06/13/2023 FINDINGS: Lower chest: No acute abnormality Hepatobiliary: No focal hepatic abnormality. Gallbladder unremarkable. Pancreas: No focal abnormality or ductal dilatation. Spleen: No focal abnormality.  Normal size. Adrenals/Urinary Tract: Continued mild right hydronephrosis due to proximal to mid right ureteral stone. This currently measures 5 mm compared to 7 mm previously, likely related to stone orientation and scan planes. Punctate nonobstructing stones in the mid and upper pole of the left kidney. No hydronephrosis on the left. Adrenal glands and urinary bladder unremarkable. Stomach/Bowel: Normal appendix. Stomach, large and small bowel grossly unremarkable. Vascular/Lymphatic: No evidence of aneurysm or adenopathy. Scattered aortic calcifications. Reproductive: No visible focal abnormality. Other: No free fluid or free air. Musculoskeletal: No acute bony abnormality. IMPRESSION: Mild right hydronephrosis due to 5 mm proximal to mid right ureteral stone. Punctate left nephrolithiasis.  No hydronephrosis on the left. Electronically Signed   By: Charlett Nose M.D.   On: 07/05/2023 21:10    Procedures Procedures    Medications Ordered in ED Medications  oxyCODONE-acetaminophen (PERCOCET/ROXICET) 5-325 MG per tablet 1 tablet (1 tablet Oral Given 07/05/23 2052)  ondansetron (ZOFRAN-ODT) disintegrating tablet 4 mg (4 mg Oral Given 07/05/23 2052)    ED Course/ Medical Decision Making/ A&P Clinical Course as of 07/05/23 2230  Fri Jul 05, 2023  2125 CT Renal Stone Study IMPRESSION: Mild right hydronephrosis due to 5 mm proximal to mid right ureteral stone.  Punctate left nephrolithiasis.  No hydronephrosis on the left.   [TY]    Clinical Course User Index [TY] Coral Spikes, DO                                 Medical Decision Making Well-appearing 37 year old male present emergency department with right sided flank/abdominal pain.  Is afebrile nontachycardic manically stable.  No fever tachycardia leukocytosis to suggest systemic infection.  Had kidney stone per chart review diagnosed 8/8 to 7 mm.  Had some hydronephrosis on the CT scan at that time.  Repeat imaging today shows similar size stone 5 mm similar hydronephrosis.  He has no elevation in creatinine to suggest impaired kidney function.  He has no electrolyte disturbances.  His UA today is negative for UTI.  Patient's pain control with Percocet.  He is in process to establish care with urology.  Will give him prescription for narcotics and Zofran.  Return precautions given.  Stable for discharge.  Amount and/or Complexity of Data Reviewed Labs: ordered. Radiology: ordered. Decision-making details documented  in ED Course.  Risk Prescription drug management.         Final Clinical Impression(s) / ED Diagnoses Final diagnoses:  None    Rx / DC Orders ED Discharge Orders     None         Coral Spikes, DO 07/05/23 2230

## 2023-07-05 NOTE — Discharge Instructions (Signed)
Return if you develop fevers, chills, nausea, vomiting, severe pain, stop urinating or develop any new or worsening symptoms that are concerning to you.

## 2024-04-22 ENCOUNTER — Encounter (HOSPITAL_COMMUNITY): Payer: Self-pay

## 2024-04-22 ENCOUNTER — Emergency Department (HOSPITAL_COMMUNITY)
Admission: EM | Admit: 2024-04-22 | Discharge: 2024-04-22 | Disposition: A | Discharge status: Home / Self Care | Payer: Self-pay | Attending: Emergency Medicine | Admitting: Emergency Medicine

## 2024-04-22 ENCOUNTER — Other Ambulatory Visit: Payer: Self-pay

## 2024-04-22 DIAGNOSIS — R112 Nausea with vomiting, unspecified: Secondary | ICD-10-CM | POA: Insufficient documentation

## 2024-04-22 DIAGNOSIS — R197 Diarrhea, unspecified: Secondary | ICD-10-CM | POA: Insufficient documentation

## 2024-04-22 DIAGNOSIS — R319 Hematuria, unspecified: Secondary | ICD-10-CM | POA: Insufficient documentation

## 2024-04-22 DIAGNOSIS — R519 Headache, unspecified: Secondary | ICD-10-CM | POA: Insufficient documentation

## 2024-04-22 HISTORY — DX: Urinary calculus, unspecified: N20.9

## 2024-04-22 HISTORY — DX: Disorder of kidney and ureter, unspecified: N28.9

## 2024-04-22 LAB — COMPREHENSIVE METABOLIC PANEL WITH GFR
ALT: 19 U/L (ref 0–44)
AST: 24 U/L (ref 15–41)
Albumin: 4.3 g/dL (ref 3.5–5.0)
Alkaline Phosphatase: 63 U/L (ref 38–126)
Anion gap: 10 (ref 5–15)
BUN: 16 mg/dL (ref 6–20)
CO2: 26 mmol/L (ref 22–32)
Calcium: 9.5 mg/dL (ref 8.9–10.3)
Chloride: 104 mmol/L (ref 98–111)
Creatinine, Ser: 1.05 mg/dL (ref 0.61–1.24)
GFR, Estimated: >60 mL/min (ref >60)
Glucose, Bld: 122 mg/dL — ABNORMAL HIGH (ref 70–99)
Potassium: 4.3 mmol/L (ref 3.5–5.1)
Sodium: 140 mmol/L (ref 135–145)
Total Bilirubin: 0.5 mg/dL (ref 0.0–1.2)
Total Protein: 7.7 g/dL (ref 6.5–8.1)

## 2024-04-22 LAB — CBC WITH DIFFERENTIAL/PLATELET
Abs Immature Granulocytes: 0.08 10*3/uL — ABNORMAL HIGH (ref 0.00–0.07)
Basophils Absolute: 0.1 10*3/uL (ref 0.0–0.1)
Basophils Relative: 1 %
Eosinophils Absolute: 0 10*3/uL (ref 0.0–0.5)
Eosinophils Relative: 0 %
HCT: 51.6 % (ref 39.0–52.0)
Hemoglobin: 17.3 g/dL — ABNORMAL HIGH (ref 13.0–17.0)
Immature Granulocytes: 1 %
Lymphocytes Relative: 10 %
Lymphs Abs: 1.2 10*3/uL (ref 0.7–4.0)
MCH: 32.8 pg (ref 26.0–34.0)
MCHC: 33.5 g/dL (ref 30.0–36.0)
MCV: 97.7 fL (ref 80.0–100.0)
Monocytes Absolute: 0.8 10*3/uL (ref 0.1–1.0)
Monocytes Relative: 7 %
Neutro Abs: 10 10*3/uL — ABNORMAL HIGH (ref 1.7–7.7)
Neutrophils Relative %: 81 %
Platelets: 252 10*3/uL (ref 150–400)
RBC: 5.28 MIL/uL (ref 4.22–5.81)
RDW: 14.6 % (ref 11.5–15.5)
WBC: 12.2 10*3/uL — ABNORMAL HIGH (ref 4.0–10.5)
nRBC: 0 % (ref 0.0–0.2)

## 2024-04-22 LAB — URINALYSIS, ROUTINE W REFLEX MICROSCOPIC
Bilirubin Urine: NEGATIVE
Glucose, UA: NEGATIVE mg/dL
Ketones, ur: 5 mg/dL — AB
Leukocytes,Ua: NEGATIVE
Nitrite: NEGATIVE
Protein, ur: 100 mg/dL — AB
Specific Gravity, Urine: 1.032 — ABNORMAL HIGH (ref 1.005–1.030)
pH: 5 (ref 5.0–8.0)

## 2024-04-22 LAB — LIPASE, BLOOD: Lipase: 25 U/L (ref 11–51)

## 2024-04-22 MED ORDER — ONDANSETRON HCL 4 MG PO TABS
4.0000 mg | ORAL_TABLET | Freq: Four times a day (QID) | ORAL | 0 refills | Status: DC
Start: 2024-04-22 — End: 2024-06-17

## 2024-04-22 MED ORDER — KETOROLAC TROMETHAMINE 30 MG/ML IJ SOLN
15.0000 mg | Freq: Once | INTRAMUSCULAR | Status: AC
  Administered 2024-04-22: 15 mg via INTRAVENOUS
  Filled 2024-04-22: qty 1

## 2024-04-22 MED ORDER — ONDANSETRON HCL 4 MG/2ML IJ SOLN
4.0000 mg | Freq: Once | INTRAMUSCULAR | Status: AC
  Administered 2024-04-22: 4 mg via INTRAVENOUS
  Filled 2024-04-22: qty 2

## 2024-04-22 MED ORDER — SODIUM CHLORIDE 0.9 % IV BOLUS
1000.0000 mL | Freq: Once | INTRAVENOUS | Status: AC
  Administered 2024-04-22: 1000 mL via INTRAVENOUS

## 2024-04-22 NOTE — Discharge Instructions (Addendum)
 Evaluation today was reassuring.  Suspect this is a viral or foodborne illness.  Treatment at home is the same which includes assertive rehydration with water and Gatorade.  You can also take Zofran  as needed for nausea or vomiting.  If you develop abdominal pain, fever, cannot tolerate fluid intake or any other concerning symptom please return to the ED for further evaluation.  You are also noted to have blood in your urine.  You do have a history of a kidney stone.  But your renal function appears to be preserved and there is no evidence of UTI today.  However do recommend that you follow-up with urology for the ongoing blood in your urine.

## 2024-04-22 NOTE — ED Provider Notes (Signed)
 [place] EMERGENCY DEPARTMENT AT [facility] Provider Note   CSN: [id number] Arrival date & time: 04/22/24  1257     Patient presents with: Emesis  HPI Alex Moore is a 38 y.o. male presenting for nausea vomiting diarrhea and headache.  Symptoms started yesterday.  Vomiting diarrhea came first.  Denies any abdominal pain or urinary symptoms.  Started to have a headache shortly after he vomited.  Pain from the headache is concentrated behind the eyes and does endorse photophobia as well but no fever, nuchal rigidity or visual disturbance.  He has tried liquid IV to stay hydrated but states he cannot keep it down.  Reports that he was diagnosed with a kidney stone in August of last year.  Denies urinary symptoms.  Past Medical History:  Diagnosis Date   Renal disorder    Urolithiasis        Emesis      Prior to Admission medications   Medication Sig Start Date End Date Taking? Authorizing Provider  ondansetron  (ZOFRAN ) 4 MG tablet Take 1 tablet (4 mg total) by mouth every 6 (six) hours. 04/22/24  Yes Anthone Prieur K, PA-C  albuterol  (VENTOLIN  HFA) 108 (90 Base) MCG/ACT inhaler Inhale 1-2 puffs into the lungs every 4 (four) hours as needed for wheezing or shortness of breath. 08/24/21   Mortenson, Ashley, MD  benzonatate  (TESSALON ) 200 MG capsule Take 1 capsule (200 mg total) by mouth 3 (three) times daily as needed for cough. 08/24/21   Ethlyn Herd, MD  fluticasone  (FLONASE ) 50 MCG/ACT nasal spray Place 2 sprays into both nostrils daily. 08/24/21   Mortenson, Ashley, MD  HYDROcodone -acetaminophen  (NORCO) 5-325 MG tablet Take 1 tablet by mouth every 6 (six) hours as needed for moderate pain. 06/13/23   Baxter Limber A, PA-C  ibuprofen  (ADVIL ) 600 MG tablet Take 1 tablet (600 mg total) by mouth 3 (three) times daily. 12/26/22   Idol, Julie, PA-C  ibuprofen  (ADVIL ) 600 MG tablet Take 1 tablet (600 mg total) by mouth every 6 (six) hours as needed. 06/13/23    Baxter Limber A, PA-C  ondansetron  (ZOFRAN -ODT) 4 MG disintegrating tablet Take 1 tablet (4 mg total) by mouth every 6 (six) hours as needed for nausea or vomiting. 06/13/23   Baxter Limber A, PA-C  Spacer/Aero-Holding Chambers (AEROCHAMBER PLUS) inhaler Use with inhaler 08/24/21   Ethlyn Herd, MD    Allergies: Patient has no known allergies.    Review of Systems  Gastrointestinal:  Positive for vomiting.    Updated Vital Signs BP 122/80 (BP Location: Right Arm)   Pulse 75   Temp 98.1 F (36.7 C)   Resp 16   Ht 5' 8 (1.727 m)   Wt 63.5 kg   SpO2 97%   BMI 21.29 kg/m   Physical Exam Vitals and nursing note reviewed.  HENT:     Head: Normocephalic and atraumatic.     Mouth/Throat:     Mouth: Mucous membranes are moist.   Eyes:     General:        Right eye: No discharge.        Left eye: No discharge.     Conjunctiva/sclera: Conjunctivae normal.    Cardiovascular:     Rate and Rhythm: Normal rate and regular rhythm.     Pulses: Normal pulses.     Heart sounds: Normal heart sounds.  Pulmonary:     Effort: Pulmonary effort is normal.     Breath sounds: Normal breath sounds.  Abdominal:  General: Abdomen is flat. There is no distension.     Palpations: Abdomen is soft.     Tenderness: There is no abdominal tenderness.   Skin:    General: Skin is warm and dry.   Neurological:     General: No focal deficit present.   Psychiatric:        Mood and Affect: Mood normal.     (all labs ordered are listed, but only abnormal results are displayed) Labs Reviewed  CBC WITH DIFFERENTIAL/PLATELET - Abnormal; Notable for the following components:      Result Value   WBC 12.2 (*)    Hemoglobin 17.3 (*)    Neutro Abs 10.0 (*)    Abs Immature Granulocytes 0.08 (*)    All other components within normal limits  COMPREHENSIVE METABOLIC PANEL WITH GFR - Abnormal; Notable for the following components:   Glucose, Bld 122 (*)    All other components within normal  limits  URINALYSIS, ROUTINE W REFLEX MICROSCOPIC - Abnormal; Notable for the following components:   Specific Gravity, Urine 1.032 (*)    Hgb urine dipstick MODERATE (*)    Ketones, ur 5 (*)    Protein, ur 100 (*)    Bacteria, UA RARE (*)    All other components within normal limits  LIPASE, BLOOD    EKG: None  Radiology: No results found.   Procedures   Medications Ordered in the ED  sodium chloride  0.9 % bolus 1,000 mL (1,000 mLs Intravenous New Bag/Given 04/22/24 1450)  ondansetron  (ZOFRAN ) injection 4 mg (4 mg Intravenous Given 04/22/24 1451)  ketorolac  (TORADOL ) 30 MG/ML injection 15 mg (15 mg Intravenous Given 04/22/24 1451)                                    Medical Decision Making Amount and/or Complexity of Data Reviewed Labs: ordered.  Risk Prescription drug management.   Initial Impression and Ddx 38 year old well-appearing male presenting for nausea vomiting and diarrhea and headache.  Exam is unremarkable.  DDx includes meningitis, hypertensive emergency, stroke, electrolyte derangement, intra-abdominal infection, kidney stone, other. Patient PMH that increases complexity of ED encounter:  h/o kidney stone  Interpretation of Diagnostics - I independent reviewed and interpreted the labs as followed: hematuria, leukocytosis  Patient Reassessment and Ultimate Disposition/Management On reassessment, he stated he was feeling much better and no longer had a headache and there was no vomiting during this encounter.  Urinalysis revealed hematuria but does not indicate UTI.  Reviewed his chart and noted that on last CT scan he had a 5 mm proximal ureteral stone on the right.  This could be contributing in someway.  He does not have flank pain, renal function is normal and he does not have a UTI, therefore did not feel he warranted CT scan at this time.  Suspect his symptoms today are likely viral versus foodborne illness.  Advised supportive treatment at home also sent  some Zofran  to his pharmacy.  Advised to follow-up with PCP and to establish care with urology.  Discussed return precautions.  Discharged condition.  Patient management required discussion with the following services or consulting groups:  None  Complexity of Problems Addressed Acute complicated illness or Injury  Additional Data Reviewed and Analyzed Further history obtained from: Past medical history and medications listed in the EMR and Prior ED visit notes  Patient Encounter Risk Assessment Prescriptions      Final  diagnoses:  Nausea vomiting and diarrhea  Nonintractable headache, unspecified chronicity pattern, unspecified headache type  Hematuria, unspecified type    ED Discharge Orders          Ordered    ondansetron  (ZOFRAN ) 4 MG tablet  Every 6 hours        04/22/24 1559               Ameli Sangiovanni K, PA-C 04/22/24 1600

## 2024-04-22 NOTE — ED Triage Notes (Signed)
 Pt arrived via POV c/o headache and emesis since yesterday. Pt reports he has been unable to keep fluids down and reports he throws everything up.

## 2024-04-29 ENCOUNTER — Ambulatory Visit: Payer: Self-pay | Admitting: Urology

## 2024-04-29 ENCOUNTER — Ambulatory Visit (HOSPITAL_COMMUNITY)
Admission: RE | Admit: 2024-04-29 | Discharge: 2024-04-29 | Disposition: A | Discharge status: Home / Self Care | Payer: Self-pay | Source: Ambulatory Visit | Attending: Urology | Admitting: Urology

## 2024-04-29 VITALS — BP 114/63 | HR 71

## 2024-04-29 DIAGNOSIS — R3129 Other microscopic hematuria: Secondary | ICD-10-CM

## 2024-04-29 DIAGNOSIS — N200 Calculus of kidney: Secondary | ICD-10-CM

## 2024-04-29 LAB — URINALYSIS, ROUTINE W REFLEX MICROSCOPIC
Bilirubin, UA: NEGATIVE
Glucose, UA: NEGATIVE
Ketones, UA: NEGATIVE
Leukocytes,UA: NEGATIVE
Nitrite, UA: NEGATIVE
Specific Gravity, UA: 1.03 (ref 1.005–1.030)
Urobilinogen, Ur: 0.2 mg/dL (ref 0.2–1.0)
pH, UA: 6 (ref 5.0–7.5)

## 2024-04-29 LAB — MICROSCOPIC EXAMINATION: Bacteria, UA: NONE SEEN

## 2024-04-29 NOTE — Progress Notes (Signed)
 04/29/2024 2:35 PM   Wiatt Melanee 08/12/1986 [id number]  Referring provider: Jakie Charleston, MD [address] [place],  [place] [address]  Flank pain   HPI: Mr Kozma is a 38yo here for evaluation of microscopic hematuria, nephrolithiasis, and flank pain. 06/2023 he underwent CT which showed a 5mm right ureteral calculus and punctate left renal calculi. He does no recall passing the calculus. Last Friday he redeveloped right abdominal pain which was moderate to severe. He now has dull right flank pain. UA today shows 11-30 RBCs/hpf. No issues urinating.    PMH: Past Medical History:  Diagnosis Date   Renal disorder    Urolithiasis     Surgical History: Past Surgical History:  Procedure Laterality Date   KNEE SURGERY     left   ORTHOPEDIC SURGERY      Home Medications:  Allergies as of 04/29/2024   No Known Allergies      Medication List        Accurate as of April 29, 2024  2:35 PM. If you have any questions, ask your nurse or doctor.          AeroChamber Plus inhaler Use with inhaler   albuterol  108 (90 Base) MCG/ACT inhaler Commonly known as: VENTOLIN  HFA Inhale 1-2 puffs into the lungs every 4 (four) hours as needed for wheezing or shortness of breath.   benzonatate  200 MG capsule Commonly known as: TESSALON  Take 1 capsule (200 mg total) by mouth 3 (three) times daily as needed for cough.   fluticasone  50 MCG/ACT nasal spray Commonly known as: FLONASE  Place 2 sprays into both nostrils daily.   HYDROcodone -acetaminophen  5-325 MG tablet Commonly known as: Norco Take 1 tablet by mouth every 6 (six) hours as needed for moderate pain.   ibuprofen  600 MG tablet Commonly known as: ADVIL  Take 1 tablet (600 mg total) by mouth 3 (three) times daily.   ibuprofen  600 MG tablet Commonly known as: ADVIL  Take 1 tablet (600 mg total) by mouth every 6 (six) hours as needed.   ondansetron  4 MG disintegrating tablet Commonly known as:  ZOFRAN -ODT Take 1 tablet (4 mg total) by mouth every 6 (six) hours as needed for nausea or vomiting.   ondansetron  4 MG tablet Commonly known as: ZOFRAN  Take 1 tablet (4 mg total) by mouth every 6 (six) hours.        Allergies: No Known Allergies  Family History: Family History  Problem Relation Age of Onset   Cancer Mother    Cancer Maternal Grandmother    Heart attack Maternal Grandfather    Stroke Maternal Grandfather     Social History:  reports that he has been smoking cigarettes. He has never used smokeless tobacco. He reports that he does not currently use alcohol after a past usage of about 1.0 standard drink of alcohol per week. He reports current drug use. Drug: Marijuana.  ROS: All other review of systems were reviewed and are negative except what is noted above in HPI  Physical Exam: BP 114/63   Pulse 71   Constitutional:  Alert and oriented, No acute distress. HEENT: [place] AT, moist mucus membranes.  Trachea midline, no masses. Cardiovascular: No clubbing, cyanosis, or edema. Respiratory: Normal respiratory effort, no increased work of breathing. GI: Abdomen is soft, nontender, nondistended, no abdominal masses GU: No CVA tenderness.  Lymph: No cervical or inguinal lymphadenopathy. Skin: No rashes, bruises or suspicious lesions. Neurologic: Grossly intact, no focal deficits, moving all 4 extremities. Psychiatric: Normal mood and  affect.  Laboratory Data: Lab Results  Component Value Date   WBC 12.2 (H) 04/22/2024   HGB 17.3 (H) 04/22/2024   HCT 51.6 04/22/2024   MCV 97.7 04/22/2024   PLT 252 04/22/2024    Lab Results  Component Value Date   CREATININE 1.05 04/22/2024    No results found for: PSA  No results found for: TESTOSTERONE  No results found for: HGBA1C  Urinalysis    Component Value Date/Time   COLORURINE YELLOW 04/22/2024 1330   APPEARANCEUR CLEAR 04/22/2024 1330   LABSPEC 1.032 (H) 04/22/2024 1330   PHURINE 5.0 04/22/2024  1330   GLUCOSEU NEGATIVE 04/22/2024 1330   HGBUR MODERATE (A) 04/22/2024 1330   BILIRUBINUR NEGATIVE 04/22/2024 1330   KETONESUR 5 (A) 04/22/2024 1330   PROTEINUR 100 (A) 04/22/2024 1330   UROBILINOGEN 0.2 05/12/2013 2138   NITRITE NEGATIVE 04/22/2024 1330   LEUKOCYTESUR NEGATIVE 04/22/2024 1330    Lab Results  Component Value Date   BACTERIA RARE (A) 04/22/2024    Pertinent Imaging: Ct 06/29/2023: Images reviewed and discussed with the patient  No results found for this or any previous visit.  No results found for this or any previous visit.  No results found for this or any previous visit.  No results found for this or any previous visit.  No results found for this or any previous visit.  No results found for this or any previous visit.  No results found for this or any previous visit.  Results for orders placed during the hospital encounter of 07/05/23  CT Renal Stone Study  Narrative CLINICAL DATA:  Abdominal pain, flank pain  EXAM: CT ABDOMEN AND PELVIS WITHOUT CONTRAST  TECHNIQUE: Multidetector CT imaging of the abdomen and pelvis was performed following the standard protocol without IV contrast.  RADIATION DOSE REDUCTION: This exam was performed according to the departmental dose-optimization program which includes automated exposure control, adjustment of the mA and/or kV according to patient size and/or use of iterative reconstruction technique.  COMPARISON:  06/13/2023  FINDINGS: Lower chest: No acute abnormality  Hepatobiliary: No focal hepatic abnormality. Gallbladder unremarkable.  Pancreas: No focal abnormality or ductal dilatation.  Spleen: No focal abnormality.  Normal size.  Adrenals/Urinary Tract: Continued mild right hydronephrosis due to proximal to mid right ureteral stone. This currently measures 5 mm compared to 7 mm previously, likely related to stone orientation and scan planes. Punctate nonobstructing stones in the mid and  upper pole of the left kidney. No hydronephrosis on the left. Adrenal glands and urinary bladder unremarkable.  Stomach/Bowel: Normal appendix. Stomach, large and small bowel grossly unremarkable.  Vascular/Lymphatic: No evidence of aneurysm or adenopathy. Scattered aortic calcifications.  Reproductive: No visible focal abnormality.  Other: No free fluid or free air.  Musculoskeletal: No acute bony abnormality.  IMPRESSION: Mild right hydronephrosis due to 5 mm proximal to mid right ureteral stone.  Punctate left nephrolithiasis.  No hydronephrosis on the left.   Electronically Signed By: Franky Crease M.D. On: 07/05/2023 21:10   Assessment & Plan:    1. Microscopic hematuria (Primary) -likely related to nephrolithiasis - Urinalysis, Routine w reflex microscopic  2. Nephrolithiasis Ct stone study, will call with results   No follow-ups on file.  [name], MD  [facility]

## 2024-05-04 ENCOUNTER — Encounter: Payer: Self-pay | Admitting: Urology

## 2024-05-04 NOTE — Patient Instructions (Signed)

## 2024-05-05 ENCOUNTER — Ambulatory Visit: Payer: Self-pay | Admitting: Urology

## 2024-05-05 ENCOUNTER — Other Ambulatory Visit: Payer: Self-pay | Admitting: Urology

## 2024-05-05 DIAGNOSIS — N200 Calculus of kidney: Secondary | ICD-10-CM

## 2024-05-19 ENCOUNTER — Telehealth: Payer: Self-pay | Admitting: Urology

## 2024-05-19 NOTE — Telephone Encounter (Signed)
 Patient called he needs to change his surgery date, he has to work.

## 2024-05-19 NOTE — Telephone Encounter (Signed)
 Communication documented in surgery referral

## 2024-05-21 ENCOUNTER — Encounter (HOSPITAL_COMMUNITY): Payer: Self-pay

## 2024-06-02 ENCOUNTER — Ambulatory Visit: Payer: Self-pay | Admitting: Urology

## 2024-06-03 ENCOUNTER — Ambulatory Visit: Payer: Self-pay | Admitting: Urology

## 2024-06-16 ENCOUNTER — Other Ambulatory Visit: Payer: Self-pay

## 2024-06-16 DIAGNOSIS — N200 Calculus of kidney: Secondary | ICD-10-CM

## 2024-06-17 ENCOUNTER — Other Ambulatory Visit (HOSPITAL_COMMUNITY): Payer: Self-pay

## 2024-06-18 ENCOUNTER — Encounter (HOSPITAL_COMMUNITY)
Admission: RE | Admit: 2024-06-18 | Discharge: 2024-06-18 | Disposition: A | Discharge status: Home / Self Care | Payer: Self-pay | Source: Ambulatory Visit | Attending: Urology | Admitting: Urology

## 2024-06-18 NOTE — Pre-Procedure Instructions (Signed)
 Attempted pre-op phone call. Left VM for him to call us back.

## 2024-06-19 ENCOUNTER — Other Ambulatory Visit: Payer: Self-pay

## 2024-06-19 NOTE — Pre-Procedure Instructions (Signed)
 Attempted pre-op phone call and left another message for him to call us  back. Also left a message on Moms' phone for her to call us  back. Office messaged because we have been unable to talk to him.

## 2024-07-08 ENCOUNTER — Encounter: Payer: Self-pay | Admitting: Urology

## 2024-07-13 ENCOUNTER — Other Ambulatory Visit: Payer: Self-pay

## 2024-07-20 ENCOUNTER — Encounter (HOSPITAL_COMMUNITY): Payer: Self-pay

## 2024-08-05 ENCOUNTER — Encounter: Payer: Self-pay | Admitting: Urology

## 2024-11-11 ENCOUNTER — Encounter (HOSPITAL_COMMUNITY): Payer: Self-pay

## 2024-11-11 ENCOUNTER — Encounter (HOSPITAL_COMMUNITY): Admission: RE | Admit: 2024-11-11 | Payer: Self-pay

## 2024-11-11 NOTE — Patient Instructions (Signed)
 "     Alex Moore  11/11/2024     @PREFPERIOPPHARMACY @   Your procedure is scheduled on 11/12/2024.   [facility] to [name] at  0750 A.M.   Call this number if you have problems the morning of surgery:  [phone]  If you experience any cold or flu symptoms such as cough, fever, chills, shortness of breath, etc. between now and your scheduled surgery, please notify us  at the above number.   Remember:  Do not eat or drink after midnight.      Take these medicines the morning of surgery with A SIP OF WATER                                                   None.    Do not wear jewelry, make-up or nail polish, including gel polish,  artificial nails, or any other type of covering on natural nails (fingers and  toes).  Do not wear lotions, powders, or perfumes, or deodorant.  Do not shave 48 hours prior to surgery.  Men may shave face and neck.  Do not bring valuables to the hospital.  [facility] is not responsible for any belongings or valuables.  Contacts, dentures or bridgework may not be worn into surgery.  Leave your suitcase in the car.  After surgery it may be brought to your room.  For patients admitted to the hospital, discharge time will be determined by your treatment team.  Patients discharged the day of surgery will not be allowed to drive home and must have someone with them for 24 hours.    Special instructions:   DO NOT smoke tobacco or vape for 24 hours before your procedure.  Please read over the following fact sheets that you were given. Coughing and Deep Breathing, Surgical Site Infection Prevention, Anesthesia Post-op Instructions, and Care and Recovery After Surgery        Ureteral Stent Implantation, Care After The following information offers guidance on how to care for yourself after your procedure. Your health care provider may also give you more specific instructions. If you have problems or questions, contact your health care  provider. What can I expect after the procedure? After the procedure, it is common to have: Nausea. Mild pain when you urinate. You may feel this pain in your lower back or lower abdomen. The pain should stop within a few minutes after you urinate. This pattern may last for up to 1 week. A small amount of blood in your urine for several days. Follow these instructions at home: Medicines Take over-the-counter and prescription medicines only as told by your health care provider. If you were prescribed antibiotics, take them as told by your health care provider. Do not stop using the antibiotic even if you start to feel better. If you were given a sedative during the procedure, it can affect you for several hours. Do not drive or operate machinery until your health care provider says that it is safe. Ask your health care provider if the medicine prescribed to you: Requires you to avoid driving or using machinery. Can cause constipation. You may need to take these actions to prevent or treat constipation: Take over-the-counter or prescription medicines. Eat foods that are high in fiber, such as beans, whole grains, and fresh fruits and vegetables. Limit foods that are high in  fat and processed sugars, such as fried or sweet foods. Activity Rest as told by your health care provider. Do not sit for a long time without moving. Get up to take short walks every 1-2 hours. This will improve blood flow and breathing. Ask for help if you feel weak or unsteady. Return to your normal activities as told by your health care provider. Ask your health care provider what activities are safe for you. General instructions  If you have a catheter: Follow instructions from your health care provider about taking care of your catheter and collection bag. Do not take baths, swim, or use a hot tub until your health care provider approves. Ask your health care provider if you may take showers. You may only be allowed  to take sponge baths. Drink enough fluid to keep your urine pale yellow. Do not use any products that contain nicotine  or tobacco. These products include cigarettes, chewing tobacco, and vaping devices, such as e-cigarettes. These can delay healing after surgery. If you need help quitting, ask your health care provider. Keep all follow-up visits. Contact a health care provider if: You start passing blood clots, or you have more than a small amount of blood in your urine. You have pain that gets worse or does not get better with medicine, especially pain when you urinate. You have trouble urinating. You feel nauseous or you vomit again and again during a period of more than 2 days after the procedure. You have a fever. Get help right away if: You are passing blood clots that are 1 inch (2.5 cm) or larger in size. You are leaking urine (have incontinence), or you cannot urinate. The end of the stent comes out of your urethra. You have sudden, sharp, or severe pain in your abdomen or lower back. You have swelling or pain in your legs. You have trouble breathing. These symptoms may be an emergency. Get help right away. Call 911. Do not wait to see if the symptoms will go away. Do not drive yourself to the hospital. Summary After the procedure, it is common to have mild pain when you urinate that goes away within a few minutes after you urinate. This may last for up to 1 week. Take over-the-counter and prescription medicines only as told by your health care provider. Drink enough fluid to keep your urine pale yellow. Call your health care provider if you start passing blood clots, or you have more than a small amount of blood in your urine. This information is not intended to replace advice given to you by your health care provider. Make sure you discuss any questions you have with your health care provider. Document Revised: 11/27/2021 Document Reviewed: 11/27/2021 Elsevier Patient Education   2024 [employer] General Anesthesia, Adult, Care After The following information offers guidance on how to care for yourself after your procedure. Your health care provider may also give you more specific instructions. If you have problems or questions, contact your health care provider. What can I expect after the procedure? After the procedure, it is common for people to: Have pain or discomfort at the IV site. Have nausea or vomiting. Have a sore throat or hoarseness. Have trouble concentrating. Feel cold or chills. Feel weak, sleepy, or tired (fatigue). Have soreness and body aches. These can affect parts of the body that were not involved in surgery. Follow these instructions at home: For the time period you were told by your health care provider:  Rest. Do not  participate in activities where you could fall or become injured. Do not drive or use machinery. Do not drink alcohol. Do not take sleeping pills or medicines that cause drowsiness. Do not make important decisions or sign legal documents. Do not take care of children on your own. General instructions Drink enough fluid to keep your urine pale yellow. If you have sleep apnea, surgery and certain medicines can increase your risk for breathing problems. Follow instructions from your health care provider about wearing your sleep device: Anytime you are sleeping, including during daytime naps. While taking prescription pain medicines, sleeping medicines, or medicines that make you drowsy. Return to your normal activities as told by your health care provider. Ask your health care provider what activities are safe for you. Take over-the-counter and prescription medicines only as told by your health care provider. Do not use any products that contain nicotine  or tobacco. These products include cigarettes, chewing tobacco, and vaping devices, such as e-cigarettes. These can delay incision healing after surgery. If you need help  quitting, ask your health care provider. Contact a health care provider if: You have nausea or vomiting that does not get better with medicine. You vomit every time you eat or drink. You have pain that does not get better with medicine. You cannot urinate or have bloody urine. You develop a skin rash. You have a fever. Get help right away if: You have trouble breathing. You have chest pain. You vomit blood. These symptoms may be an emergency. Get help right away. Call 911. Do not wait to see if the symptoms will go away. Do not drive yourself to the hospital. Summary After the procedure, it is common to have a sore throat, hoarseness, nausea, vomiting, or to feel weak, sleepy, or fatigue. For the time period you were told by your health care provider, do not drive or use machinery. Get help right away if you have difficulty breathing, have chest pain, or vomit blood. These symptoms may be an emergency. This information is not intended to replace advice given to you by your health care provider. Make sure you discuss any questions you have with your health care provider. Document Revised: 01/19/2022 Document Reviewed: 01/19/2022 Elsevier Patient Education  2024 [employer]. "

## 2024-11-25 ENCOUNTER — Encounter: Payer: Self-pay | Admitting: Urology

## 2024-11-27 ENCOUNTER — Other Ambulatory Visit: Payer: Self-pay

## 2024-11-27 ENCOUNTER — Encounter (HOSPITAL_COMMUNITY): Payer: Self-pay

## 2024-11-30 ENCOUNTER — Other Ambulatory Visit: Payer: PRIVATE HEALTH INSURANCE

## 2024-12-01 ENCOUNTER — Other Ambulatory Visit: Payer: PRIVATE HEALTH INSURANCE

## 2024-12-01 ENCOUNTER — Encounter (HOSPITAL_COMMUNITY)
Admission: RE | Admit: 2024-12-01 | Discharge: 2024-12-01 | Disposition: A | Payer: PRIVATE HEALTH INSURANCE | Source: Ambulatory Visit | Attending: Urology

## 2024-12-01 ENCOUNTER — Other Ambulatory Visit (HOSPITAL_COMMUNITY)
Admission: RE | Admit: 2024-12-01 | Discharge: 2024-12-01 | Disposition: A | Payer: PRIVATE HEALTH INSURANCE | Source: Ambulatory Visit | Attending: Urology | Admitting: Urology

## 2024-12-01 DIAGNOSIS — Z01812 Encounter for preprocedural laboratory examination: Secondary | ICD-10-CM | POA: Insufficient documentation

## 2024-12-01 DIAGNOSIS — N200 Calculus of kidney: Secondary | ICD-10-CM | POA: Insufficient documentation

## 2024-12-01 HISTORY — DX: Personal history of urinary calculi: Z87.442

## 2024-12-01 LAB — URINALYSIS, ROUTINE W REFLEX MICROSCOPIC
Bilirubin Urine: NEGATIVE
Glucose, UA: NEGATIVE mg/dL
Hgb urine dipstick: NEGATIVE
Ketones, ur: NEGATIVE mg/dL
Leukocytes,Ua: NEGATIVE
Nitrite: NEGATIVE
Protein, ur: NEGATIVE mg/dL
Specific Gravity, Urine: 1.018 (ref 1.005–1.030)
pH: 6 (ref 5.0–8.0)

## 2024-12-07 ENCOUNTER — Encounter (HOSPITAL_COMMUNITY)
Admission: RE | Disposition: A | Discharge status: Home / Self Care | Payer: Self-pay | Source: Home / Self Care | Attending: Urology

## 2024-12-07 ENCOUNTER — Ambulatory Visit (HOSPITAL_COMMUNITY): Payer: Self-pay | Admitting: Anesthesiology

## 2024-12-07 ENCOUNTER — Encounter (HOSPITAL_COMMUNITY): Payer: Self-pay | Admitting: Urology

## 2024-12-07 ENCOUNTER — Ambulatory Visit (HOSPITAL_COMMUNITY): Payer: PRIVATE HEALTH INSURANCE

## 2024-12-07 ENCOUNTER — Ambulatory Visit (HOSPITAL_COMMUNITY)
Admission: RE | Admit: 2024-12-07 | Discharge: 2024-12-07 | Disposition: A | Payer: Self-pay | Attending: Urology | Admitting: Urology

## 2024-12-07 DIAGNOSIS — Z0389 Encounter for observation for other suspected diseases and conditions ruled out: Secondary | ICD-10-CM | POA: Diagnosis not present

## 2024-12-07 DIAGNOSIS — N201 Calculus of ureter: Secondary | ICD-10-CM | POA: Insufficient documentation

## 2024-12-07 DIAGNOSIS — I10 Essential (primary) hypertension: Secondary | ICD-10-CM | POA: Insufficient documentation

## 2024-12-07 DIAGNOSIS — F17210 Nicotine dependence, cigarettes, uncomplicated: Secondary | ICD-10-CM | POA: Insufficient documentation

## 2024-12-07 DIAGNOSIS — N200 Calculus of kidney: Secondary | ICD-10-CM

## 2024-12-07 DIAGNOSIS — N1339 Other hydronephrosis: Secondary | ICD-10-CM

## 2024-12-07 MED ORDER — DEXMEDETOMIDINE HCL IN NACL 80 MCG/20ML IV SOLN
INTRAVENOUS | Status: DC | PRN
  Administered 2024-12-07: 12 ug via INTRAVENOUS

## 2024-12-07 MED ORDER — MIDAZOLAM HCL 2 MG/2ML IJ SOLN
INTRAMUSCULAR | Status: AC
  Filled 2024-12-07: qty 2

## 2024-12-07 MED ORDER — GLYCOPYRROLATE PF 0.2 MG/ML IJ SOSY
PREFILLED_SYRINGE | INTRAMUSCULAR | Status: AC
  Filled 2024-12-07: qty 1

## 2024-12-07 MED ORDER — CHLORHEXIDINE GLUCONATE 0.12 % MT SOLN: 15.0000 mL | Freq: Once | OROMUCOSAL | Status: AC

## 2024-12-07 MED ORDER — ONDANSETRON HCL 4 MG/2ML IJ SOLN
INTRAMUSCULAR | Status: AC
  Filled 2024-12-07: qty 2

## 2024-12-07 MED ORDER — LACTATED RINGERS IV SOLN: INTRAVENOUS | Status: DC

## 2024-12-07 MED ORDER — SODIUM CHLORIDE 0.9 % IR SOLN
Status: DC | PRN
  Administered 2024-12-07: 3000 mL

## 2024-12-07 MED ORDER — CHLORHEXIDINE GLUCONATE 0.12 % MT SOLN: 15.0000 mL | Freq: Once | OROMUCOSAL | Status: DC

## 2024-12-07 MED ORDER — FENTANYL CITRATE (PF) 100 MCG/2ML IJ SOLN
INTRAMUSCULAR | Status: AC
  Filled 2024-12-07: qty 2

## 2024-12-07 MED ORDER — FENTANYL CITRATE (PF) 250 MCG/5ML IJ SOLN
INTRAMUSCULAR | Status: DC | PRN
  Administered 2024-12-07 (×2): 25 ug via INTRAVENOUS

## 2024-12-07 MED ORDER — HYDROCODONE-ACETAMINOPHEN 5-325 MG PO TABS: 1.0000 | ORAL_TABLET | Freq: Four times a day (QID) | ORAL | 0 refills | Status: AC | PRN

## 2024-12-07 MED ORDER — LIDOCAINE 2% (20 MG/ML) 5 ML SYRINGE
INTRAMUSCULAR | Status: AC
  Filled 2024-12-07: qty 5

## 2024-12-07 MED ORDER — PROPOFOL 10 MG/ML IV BOLUS
INTRAVENOUS | Status: AC
  Filled 2024-12-07: qty 20

## 2024-12-07 MED ORDER — GLYCOPYRROLATE PF 0.2 MG/ML IJ SOSY
PREFILLED_SYRINGE | INTRAMUSCULAR | Status: DC | PRN
  Administered 2024-12-07: .2 mg via INTRAVENOUS

## 2024-12-07 MED ORDER — CHLORHEXIDINE GLUCONATE 0.12 % MT SOLN
OROMUCOSAL | Status: AC
  Administered 2024-12-07: 15 mL via OROMUCOSAL
  Filled 2024-12-07: qty 15

## 2024-12-07 MED ORDER — DEXMEDETOMIDINE HCL IN NACL 80 MCG/20ML IV SOLN
INTRAVENOUS | Status: AC
  Filled 2024-12-07: qty 20

## 2024-12-07 MED ORDER — CEFAZOLIN SODIUM-DEXTROSE 2-4 GM/100ML-% IV SOLN
2.0000 g | INTRAVENOUS | Status: AC
  Administered 2024-12-07: 2 g via INTRAVENOUS
  Filled 2024-12-07: qty 100

## 2024-12-07 MED ORDER — LIDOCAINE 2% (20 MG/ML) 5 ML SYRINGE
INTRAMUSCULAR | Status: DC | PRN
  Administered 2024-12-07: 80 mg via INTRAVENOUS

## 2024-12-07 MED ORDER — DEXAMETHASONE SOD PHOSPHATE PF 10 MG/ML IJ SOLN
INTRAMUSCULAR | Status: AC
  Filled 2024-12-07: qty 1

## 2024-12-07 MED ORDER — DEXAMETHASONE SOD PHOSPHATE PF 10 MG/ML IJ SOLN
INTRAMUSCULAR | Status: DC | PRN
  Administered 2024-12-07: 10 mg via INTRAVENOUS

## 2024-12-07 MED ORDER — PROPOFOL 10 MG/ML IV BOLUS
INTRAVENOUS | Status: DC | PRN
  Administered 2024-12-07: 180 mg via INTRAVENOUS

## 2024-12-07 MED ORDER — DIATRIZOATE MEGLUMINE 30 % UR SOLN
URETHRAL | Status: DC | PRN
  Administered 2024-12-07: 8 mL via URETHRAL

## 2024-12-07 MED ORDER — MIDAZOLAM HCL 5 MG/5ML IJ SOLN
INTRAMUSCULAR | Status: DC | PRN
  Administered 2024-12-07: 2 mg via INTRAVENOUS

## 2024-12-07 MED ORDER — WATER FOR IRRIGATION, STERILE IR SOLN
Status: DC | PRN
  Administered 2024-12-07: 500 mL

## 2024-12-07 MED ORDER — ONDANSETRON HCL 4 MG/2ML IJ SOLN
INTRAMUSCULAR | Status: DC | PRN
  Administered 2024-12-07: 4 mg via INTRAVENOUS

## 2024-12-07 MED ORDER — DIATRIZOATE MEGLUMINE 30 % UR SOLN
URETHRAL | Status: AC
  Filled 2024-12-07: qty 100

## 2024-12-07 MED ORDER — ORAL CARE MOUTH RINSE: 15.0000 mL | Freq: Once | OROMUCOSAL | Status: DC

## 2024-12-07 NOTE — H&P (Signed)
 Urology Admission H&P  Chief Complaint: left ureteral calculus  History of Present Illness: [name] is a 38yo here for left ureteroscopic stone extraction for a left distal ureteral calculus. He has not passed his stone. He denies nay LUTS  Past Medical History:  Diagnosis Date   History of kidney stones    Urolithiasis    Past Surgical History:  Procedure Laterality Date   KNEE SURGERY     left   ORTHOPEDIC SURGERY      Home Medications:  Current Facility-Administered Medications  Medication Dose Route Frequency Provider Last Rate Last Admin   ceFAZolin  (ANCEF ) IVPB 2g/100 mL premix  2 g Intravenous 30 min Pre-Op Shahara Hartsfield, Belvie CROME, MD       lactated ringers  infusion   Intravenous Continuous Kendell [name], MD 40 mL/hr at 12/07/24 0853 New Bag at 12/07/24 0853   Allergies: Allergies[1]  Family History  Problem Relation Age of Onset   Cancer Mother    Cancer Maternal Grandmother    Heart attack Maternal Grandfather    Stroke Maternal Grandfather    Social History:  reports that he has been smoking cigarettes. He has been smoking an average of 0.5 packs per day. He has never used smokeless tobacco. He reports that he does not currently use alcohol after a past usage of about 1.0 standard drink of alcohol per week. He reports current drug use. Drug: Marijuana.  Review of Systems  All other systems reviewed and are negative.   Physical Exam:  Vital signs in last 24 hours: Temp:  [98.6 F (37 C)] 98.6 F (37 C) (02/02 0850) Resp:  [14] 14 (02/02 0850) BP: (118)/(97) 118/97 (02/02 0850) SpO2:  [100 %] 100 % (02/02 0850) Weight:  [63.5 kg] 63.5 kg (02/02 0830) Physical Exam Constitutional:      Appearance: Normal appearance.  HENT:     Head: Normocephalic and atraumatic.     Mouth/Throat:     Mouth: Mucous membranes are dry.  Eyes:     Extraocular Movements: Extraocular movements intact.     Pupils: Pupils are equal, round, and reactive to light.   Cardiovascular:     Rate and Rhythm: Normal rate and regular rhythm.  Pulmonary:     Effort: Pulmonary effort is normal. No respiratory distress.  Abdominal:     General: Abdomen is flat. There is no distension.  Musculoskeletal:        General: No swelling. Normal range of motion.     Cervical back: Normal range of motion and neck supple.  Skin:    General: Skin is warm and dry.  Neurological:     General: No focal deficit present.     Mental Status: He is alert and oriented to person, place, and time.  Psychiatric:        Mood and Affect: Mood normal.        Behavior: Behavior normal.        Thought Content: Thought content normal.        Judgment: Judgment normal.     Laboratory Data:  No results found for this or any previous visit (from the past 24 hours). No results found for this or any previous visit (from the past 240 hours). Creatinine: No results for input(s): CREATININE in the last 168 hours. Baseline Creatinine:   Impression/Assessment:  39yo with a left ureteral stone  Plan:  -We discussed the management of kidney stones. These options include observation, ureteroscopy, shockwave lithotripsy (ESWL) and percutaneous nephrolithotomy (PCNL).  We discussed which options are relevant to the patient's stone(s). We discussed the natural history of kidney stones as well as the complications of untreated stones and the impact on quality of life without treatment as well as with each of the above listed treatments. We also discussed the efficacy of each treatment in its ability to clear the stone burden. With any of these management options I discussed the signs and symptoms of infection and the need for emergent treatment should these be experienced. For each option we discussed the ability of each procedure to clear the patient of their stone burden.   For observation I described the risks which include but are not limited to silent renal damage, life-threatening  infection, need for emergent surgery, failure to pass stone and pain.   For ureteroscopy I described the risks which include bleeding, infection, damage to contiguous structures, positioning injury, ureteral stricture, ureteral avulsion, ureteral injury, need for prolonged ureteral stent, inability to perform ureteroscopy, need for an interval procedure, inability to clear stone burden, stent discomfort/pain, heart attack, stroke, pulmonary embolus and the inherent risks with general anesthesia.   For shockwave lithotripsy I described the risks which include arrhythmia, kidney contusion, kidney hemorrhage, need for transfusion, pain, inability to adequately break up stone, inability to pass stone fragments, Steinstrasse, infection associated with obstructing stones, need for alternate surgical procedure, need for repeat shockwave lithotripsy, MI, CVA, PE and the inherent risks with anesthesia/conscious sedation.   For PCNL I described the risks including positioning injury, pneumothorax, hydrothorax, need for chest tube, inability to clear stone burden, renal laceration, arterial venous fistula or malformation, need for embolization of kidney, loss of kidney or renal function, need for repeat procedure, need for prolonged nephrostomy tube, ureteral avulsion, MI, CVA, PE and the inherent risks of general anesthesia.   - The patient would like to proceed with left ureteroscopic stone extraction  [name] 12/07/2024, 9:27 AM          [1] No Known Allergies

## 2024-12-07 NOTE — Anesthesia Preprocedure Evaluation (Signed)
 Anesthesia Evaluation  Patient identified by MRN, date of birth, ID band Patient awake    Reviewed: Allergy & Precautions, H&P , NPO status , Patient's Chart, lab work & pertinent test results, reviewed documented beta blocker date and time   Airway Mallampati: II  TM Distance: >3 FB Neck ROM: full    Dental no notable dental hx.    Pulmonary neg pulmonary ROS, Current Smoker and Patient abstained from smoking.   Pulmonary exam normal breath sounds clear to auscultation       Cardiovascular Exercise Tolerance: Good hypertension, negative cardio ROS  Rhythm:regular Rate:Normal     Neuro/Psych negative neurological ROS  negative psych ROS   GI/Hepatic negative GI ROS, Neg liver ROS,,,  Endo/Other  negative endocrine ROS    Renal/GU negative Renal ROS  negative genitourinary   Musculoskeletal   Abdominal   Peds  Hematology negative hematology ROS (+)   Anesthesia Other Findings   Reproductive/Obstetrics negative OB ROS                             Anesthesia Physical Anesthesia Plan  ASA: 2  Anesthesia Plan: General and General LMA   Post-op Pain Management:    Induction:   PONV Risk Score and Plan: Ondansetron  Airway Management Planned:   Additional Equipment:   Intra-op Plan:   Post-operative Plan:   Informed Consent: I have reviewed the patients History and Physical, chart, labs and discussed the procedure including the risks, benefits and alternatives for the proposed anesthesia with the patient or authorized representative who has indicated his/her understanding and acceptance.     Dental Advisory Given  Plan Discussed with: CRNA  Anesthesia Plan Comments:        Anesthesia Quick Evaluation

## 2024-12-07 NOTE — Anesthesia Procedure Notes (Signed)
 Procedure Name: LMA Insertion Date/Time: 12/07/2024 10:07 AM  Performed by: Lynnetta Darrel Lebron Mickey., CRNAPre-anesthesia Checklist: Patient identified, Emergency Drugs available, Suction available and Patient being monitored Patient Re-evaluated:Patient Re-evaluated prior to induction Oxygen Delivery Method: Circle System Utilized Preoxygenation: Pre-oxygenation with 100% oxygen Induction Type: IV induction Ventilation: Mask ventilation without difficulty LMA: LMA inserted LMA Size: 4.0 Number of attempts: 1 Placement Confirmation: positive ETCO2 Tube secured with: Tape Dental Injury: Teeth and Oropharynx as per pre-operative assessment

## 2024-12-08 ENCOUNTER — Encounter (HOSPITAL_COMMUNITY): Payer: Self-pay | Admitting: Urology

## 2024-12-08 NOTE — Anesthesia Postprocedure Evaluation (Signed)
"   Anesthesia Post Note  Patient: Alex Moore  Procedure(s) Performed: CYSTOSCOPY/URETEROSCOPY DIAGNOSTIC (Right: Ureter)  Patient location during evaluation: Phase II Anesthesia Type: General Level of consciousness: awake Pain management: pain level controlled Vital Signs Assessment: post-procedure vital signs reviewed and stable Respiratory status: spontaneous breathing and respiratory function stable Cardiovascular status: blood pressure returned to baseline and stable Postop Assessment: no headache and no apparent nausea or vomiting Anesthetic complications: no Comments: Late entry   No notable events documented.   Last Vitals:  Vitals:   12/07/24 1100 12/07/24 1109  BP: 114/76 126/81  Pulse: (!) 50 (!) 53  Resp: 11 17  Temp: 36.4 C 36.4 C  SpO2: 100% 100%    Last Pain:  Vitals:   12/08/24 0925  PainSc: 2                  [name] Kassadi Presswood      "

## 2024-12-18 ENCOUNTER — Encounter: Payer: Self-pay | Admitting: Urology
